# Patient Record
Sex: Male | Born: 1970 | Race: Black or African American | Hispanic: No | Marital: Married | Smoking: Former smoker
Health system: Southern US, Community
[De-identification: ages and names within clinical notes are randomized; demographics above are authoritative.]

## PROBLEM LIST (undated history)

## (undated) DIAGNOSIS — M6740 Ganglion, unspecified site: Secondary | ICD-10-CM

## (undated) DIAGNOSIS — K219 Gastro-esophageal reflux disease without esophagitis: Secondary | ICD-10-CM

## (undated) DIAGNOSIS — R030 Elevated blood-pressure reading, without diagnosis of hypertension: Secondary | ICD-10-CM

## (undated) DIAGNOSIS — M7040 Prepatellar bursitis, unspecified knee: Secondary | ICD-10-CM

## (undated) HISTORY — DX: Ganglion, unspecified site: M67.40

## (undated) HISTORY — DX: Gastro-esophageal reflux disease without esophagitis: K21.9

## (undated) HISTORY — DX: Prepatellar bursitis, unspecified knee: M70.40

## (undated) HISTORY — DX: Elevated blood-pressure reading, without diagnosis of hypertension: R03.0

---

## 1991-12-05 HISTORY — PX: THUMB ARTHROSCOPY: SHX2509

## 2009-07-19 ENCOUNTER — Ambulatory Visit: Payer: Self-pay | Admitting: Family Medicine

## 2009-07-19 DIAGNOSIS — M6740 Ganglion, unspecified site: Secondary | ICD-10-CM

## 2009-07-19 DIAGNOSIS — M7040 Prepatellar bursitis, unspecified knee: Secondary | ICD-10-CM

## 2009-07-19 DIAGNOSIS — K219 Gastro-esophageal reflux disease without esophagitis: Secondary | ICD-10-CM

## 2009-07-19 DIAGNOSIS — R030 Elevated blood-pressure reading, without diagnosis of hypertension: Secondary | ICD-10-CM | POA: Insufficient documentation

## 2009-07-19 HISTORY — DX: Gastro-esophageal reflux disease without esophagitis: K21.9

## 2009-07-19 HISTORY — DX: Prepatellar bursitis, unspecified knee: M70.40

## 2009-07-19 HISTORY — DX: Elevated blood-pressure reading, without diagnosis of hypertension: R03.0

## 2009-07-19 HISTORY — DX: Ganglion, unspecified site: M67.40

## 2011-10-24 ENCOUNTER — Telehealth: Payer: Self-pay | Admitting: Family Medicine

## 2011-10-24 NOTE — Telephone Encounter (Addendum)
Pt wife calling concerning hus bp yesterday was 190/110. Please call pt at [phone]. Pt saw MD over 2 yrs ago.

## 2011-10-24 NOTE — Telephone Encounter (Signed)
Pt returned call. Pls call back on his cell#, which is [phone]

## 2011-10-24 NOTE — Telephone Encounter (Signed)
No answer, and no voice mail. 

## 2011-10-25 ENCOUNTER — Telehealth: Payer: Self-pay | Admitting: *Deleted

## 2011-10-25 NOTE — Telephone Encounter (Signed)
BP reading this am was 143/88, and will schedule a CPX with Dr. Caryl Never after the holiday.

## 2011-10-27 NOTE — Telephone Encounter (Signed)
Benjamin Odom called back to let us know his BP was 143/88, and feeling fine.  Actually, would like to change his CPX appt unless it does go back up again.  Will schedule the CPX and work him in if he need to be seen for high BP.

## 2011-10-27 NOTE — Telephone Encounter (Signed)
Returned pt's call with no answer but left a extended note on Dr. Lucie Leather instructions.

## 2011-10-27 NOTE — Telephone Encounter (Signed)
Recheck today if possible and if still that elevated needs to be seen.  OK to see at end of schedule today if they are able.

## 2011-10-30 NOTE — Telephone Encounter (Signed)
Called patient to see how his bp is doing. Pt said that he has not rechecked bp since call last wk. Pt says that he feels that his bp is doing ok now and pt said that he will call back if there are any future problems.

## 2011-12-01 ENCOUNTER — Encounter: Payer: Self-pay | Admitting: Family Medicine

## 2011-12-01 ENCOUNTER — Encounter: Payer: Self-pay | Admitting: *Deleted

## 2012-01-08 ENCOUNTER — Encounter: Payer: Self-pay | Admitting: Family Medicine

## 2012-01-10 ENCOUNTER — Encounter: Payer: Self-pay | Admitting: Family Medicine

## 2012-01-10 ENCOUNTER — Ambulatory Visit (INDEPENDENT_AMBULATORY_CARE_PROVIDER_SITE_OTHER): Payer: BC Managed Care – PPO | Admitting: Family Medicine

## 2012-01-10 VITALS — BP 160/90 | HR 80 | Temp 98.4°F | Resp 12 | Ht 69.75 in | Wt 242.0 lb

## 2012-01-10 DIAGNOSIS — R030 Elevated blood-pressure reading, without diagnosis of hypertension: Secondary | ICD-10-CM

## 2012-01-10 DIAGNOSIS — Z0000 Encounter for general adult medical examination without abnormal findings: Secondary | ICD-10-CM

## 2012-01-10 LAB — PSA: PSA: 0.19 ng/mL (ref 0.10–4.00)

## 2012-01-10 LAB — BASIC METABOLIC PANEL
Calcium: 9.8 mg/dL (ref 8.4–10.5)
GFR: 114.64 mL/min (ref >60.00)
Potassium: 4.4 mEq/L (ref 3.5–5.1)
Sodium: 138 mEq/L (ref 135–145)

## 2012-01-10 LAB — HEPATIC FUNCTION PANEL
ALT: 23 U/L (ref 0–53)
Total Bilirubin: 0.9 mg/dL (ref 0.3–1.2)

## 2012-01-10 LAB — POCT URINALYSIS DIPSTICK
Bilirubin, UA: NEGATIVE
Glucose, UA: NEGATIVE
Leukocytes, UA: NEGATIVE
Nitrite, UA: NEGATIVE
pH, UA: 6

## 2012-01-10 LAB — TSH: TSH: 1.1 u[IU]/mL (ref 0.35–5.50)

## 2012-01-10 LAB — CBC WITH DIFFERENTIAL/PLATELET
Basophils Absolute: 0 10*3/uL (ref 0.0–0.1)
Eosinophils Absolute: 0 10*3/uL (ref 0.0–0.7)
MCHC: 33.5 g/dL (ref 30.0–36.0)
MCV: 91.2 fl (ref 78.0–100.0)
Monocytes Absolute: 0.4 10*3/uL (ref 0.1–1.0)
Neutrophils Relative %: 73.5 % (ref 43.0–77.0)
Platelets: 362 10*3/uL (ref 150.0–400.0)
RDW: 13.7 % (ref 11.5–14.6)

## 2012-01-10 LAB — LIPID PANEL
HDL: 44.2 mg/dL (ref >39.00)
Triglycerides: 139 mg/dL (ref 0.0–149.0)

## 2012-01-10 NOTE — Progress Notes (Signed)
  Subjective:    Patient ID: Benjamin Odom, male    DOB: [birth year], 41 y.o.   MRN: [id number]  HPI  Patient here for complete physical. He was seen here almost 3 years ago with elevated blood pressure but never followed up. He is exercising about 3 days per week. He has strong family history of hypertension. He has never had any lab work. Last tetanus 2007. Denies any headaches, chest pains, or dizziness. Nonsmoker. Drinks occasional beer on weekends  Past Medical History  Diagnosis Date  . GERD 07/19/2009  . Prepatellar bursitis 07/19/2009  . GANGLION CYST 07/19/2009  . ELEVATED BLOOD PRESSURE 07/19/2009   Past Surgical History  Procedure Date  . Thumb arthroscopy 1993    re-attachment Rt thumb    reports that he quit smoking about 14 years ago. His smoking use included Cigarettes. He has a 3 pack-year smoking history. He does not have any smokeless tobacco history on file. His alcohol and drug histories not on file. family history includes Arthritis in his other; Cancer in his other; Diabetes in his maternal grandfather, mother, and other; and Hypertension in his mother and other. No Known Allergies    Review of Systems  Constitutional: Negative for fever, activity change, appetite change and fatigue.  HENT: Negative for ear pain, congestion and trouble swallowing.   Eyes: Negative for pain and visual disturbance.  Respiratory: Negative for cough, shortness of breath and wheezing.   Cardiovascular: Negative for chest pain and palpitations.  Gastrointestinal: Negative for nausea, vomiting, abdominal pain, diarrhea, constipation, blood in stool, abdominal distention and rectal pain.  Genitourinary: Negative for dysuria, hematuria and testicular pain.  Musculoskeletal: Negative for joint swelling and arthralgias.  Skin: Negative for rash.  Neurological: Negative for dizziness, syncope and headaches.  Hematological: Negative for adenopathy.  Psychiatric/Behavioral: Negative for  confusion and dysphoric mood.       Objective:   Physical Exam  Constitutional: He is oriented to person, place, and time. He appears well-developed and well-nourished. No distress.  HENT:  Head: Normocephalic and atraumatic.  Right Ear: External ear normal.  Left Ear: External ear normal.  Mouth/Throat: Oropharynx is clear and moist.  Eyes: Conjunctivae and EOM are normal. Pupils are equal, round, and reactive to light.  Neck: Normal range of motion. Neck supple. No thyromegaly present.  Cardiovascular: Normal rate, regular rhythm and normal heart sounds.   No murmur heard. Pulmonary/Chest: No respiratory distress. He has no wheezes. He has no rales.  Abdominal: Soft. Bowel sounds are normal. He exhibits no distension and no mass. There is no tenderness. There is no rebound and no guarding.  Musculoskeletal: He exhibits no edema.  Lymphadenopathy:    He has no cervical adenopathy.  Neurological: He is alert and oriented to person, place, and time. He displays normal reflexes. No cranial nerve deficit.  Skin: No rash noted.  Psychiatric: He has a normal mood and affect.          Assessment & Plan:  #1 complete physical. Continue weight loss and exercise efforts. Obtain screening lab work. #2 elevated blood pressure. Handout on hypertension given. Work on weight loss. Home blood pressure monitor and record readings and followup in one month to reassess. Consider medications then if consistently above 140/90.

## 2012-01-10 NOTE — Patient Instructions (Addendum)
Get home blood pressure monitor and record readings and bring back in one month to review Continue exercise and weight loss efforts.  Hypertension As your heart beats, it forces blood through your arteries. This force is your blood pressure. If the pressure is too high, it is called hypertension (HTN) or high blood pressure. HTN is dangerous because you may have it and not know it. High blood pressure may mean that your heart has to work harder to pump blood. Your arteries may be narrow or stiff. The extra work puts you at risk for heart disease, stroke, and other problems.  Blood pressure consists of two numbers, a higher number over a lower, 110/72, for example. It is stated as "110 over 72." The ideal is below 120 for the top number (systolic) and under 80 for the bottom (diastolic). Write down your blood pressure today. You should pay close attention to your blood pressure if you have certain conditions such as:  Heart failure.   Prior heart attack.   Diabetes   Chronic kidney disease.   Prior stroke.   Multiple risk factors for heart disease.  To see if you have HTN, your blood pressure should be measured while you are seated with your arm held at the level of the heart. It should be measured at least twice. A one-time elevated blood pressure reading (especially in the Emergency Department) does not mean that you need treatment. There may be conditions in which the blood pressure is different between your right and left arms. It is important to see your caregiver soon for a recheck. Most people have essential hypertension which means that there is not a specific cause. This type of high blood pressure may be lowered by changing lifestyle factors such as:  Stress.   Smoking.   Lack of exercise.   Excessive weight.   Drug/tobacco/alcohol use.   Eating less salt.  Most people do not have symptoms from high blood pressure until it has caused damage to the body. Effective treatment  can often prevent, delay or reduce that damage. TREATMENT  When a cause has been identified, treatment for high blood pressure is directed at the cause. There are a large number of medications to treat HTN. These fall into several categories, and your caregiver will help you select the medicines that are best for you. Medications may have side effects. You should review side effects with your caregiver. If your blood pressure stays high after you have made lifestyle changes or started on medicines,   Your medication(s) may need to be changed.   Other problems may need to be addressed.   Be certain you understand your prescriptions, and know how and when to take your medicine.   Be sure to follow up with your caregiver within the time frame advised (usually within two weeks) to have your blood pressure rechecked and to review your medications.   If you are taking more than one medicine to lower your blood pressure, make sure you know how and at what times they should be taken. Taking two medicines at the same time can result in blood pressure that is too low.  SEEK IMMEDIATE MEDICAL CARE IF:  You develop a severe headache, blurred or changing vision, or confusion.   You have unusual weakness or numbness, or a faint feeling.   You have severe chest or abdominal pain, vomiting, or breathing problems.  MAKE SURE YOU:   Understand these instructions.   Will watch your condition.  Will get help right away if you are not doing well or get worse.  Document Released: 11/20/2005 Document Revised: 08/02/2011 Document Reviewed: 07/10/2008 [facility] Patient Information 2012 [place], [place].

## 2012-01-11 NOTE — Progress Notes (Signed)
Quick Note:  Pt informed ______ 

## 2012-02-12 ENCOUNTER — Ambulatory Visit (INDEPENDENT_AMBULATORY_CARE_PROVIDER_SITE_OTHER): Payer: BC Managed Care – PPO | Admitting: Family Medicine

## 2012-02-12 ENCOUNTER — Encounter: Payer: Self-pay | Admitting: Family Medicine

## 2012-02-12 VITALS — BP 150/92 | Temp 98.6°F | Wt 246.0 lb

## 2012-02-12 DIAGNOSIS — R030 Elevated blood-pressure reading, without diagnosis of hypertension: Secondary | ICD-10-CM

## 2012-02-12 DIAGNOSIS — I10 Essential (primary) hypertension: Secondary | ICD-10-CM | POA: Insufficient documentation

## 2012-02-12 MED ORDER — LOSARTAN POTASSIUM 50 MG PO TABS: 50.0000 mg | ORAL_TABLET | Freq: Every day | ORAL | Status: DC

## 2012-02-12 NOTE — Progress Notes (Signed)
  Subjective:    Patient ID: Benjamin Odom, male    DOB: [birth year], 41 y.o.   MRN: [id number]  HPI  Followup hypertension. Monitoring home blood pressure cuff. Consistently 140-150 systolic and 90s diastolic. Exercising regularly sometimes one hour per day with aerobic exercise. No headaches or dizziness. Never treated for hypertension. Trying to be cautious with his sodium intake. No excessive alcohol use.   Review of Systems  Constitutional: Negative for fatigue.  Eyes: Negative for visual disturbance.  Respiratory: Negative for cough, chest tightness and shortness of breath.   Cardiovascular: Negative for chest pain, palpitations and leg swelling.  Neurological: Negative for dizziness, syncope, weakness, light-headedness and headaches.       Objective:   Physical Exam  Constitutional: He appears well-developed and well-nourished.  Neck: Neck supple. No thyromegaly present.  Cardiovascular: Normal rate and regular rhythm.   Pulmonary/Chest: Effort normal and breath sounds normal. No respiratory distress. He has no wheezes. He has no rales.  Musculoskeletal: He exhibits no edema.          Assessment & Plan:  Hypertension. Currently untreated. No improvement over the past month. Start losartan 50 mg daily. Continue home monitoring. Reassess here in 2 months. Continue weight loss efforts.

## 2012-03-19 ENCOUNTER — Telehealth: Payer: Self-pay | Admitting: Family Medicine

## 2012-03-19 NOTE — Telephone Encounter (Signed)
Questions re: Cozar Losartan 50mg  . Pls call.

## 2012-03-19 NOTE — Telephone Encounter (Signed)
LMTCB

## 2012-03-21 NOTE — Telephone Encounter (Signed)
Pt reports  Initially he was bloated, thinks that sx may be improving as he gets used to the med.  He is really concerned as his appetite has increased since new med, has gained at least 5-6 pounds.

## 2012-03-21 NOTE — Telephone Encounter (Signed)
Weight gain is not typical for this medication.  Would stay the course until follow up.

## 2012-03-22 NOTE — Telephone Encounter (Signed)
Pt informed, has F/U in 3 weeks

## 2012-04-08 ENCOUNTER — Ambulatory Visit (INDEPENDENT_AMBULATORY_CARE_PROVIDER_SITE_OTHER): Payer: BC Managed Care – PPO | Admitting: Family Medicine

## 2012-04-08 ENCOUNTER — Encounter: Payer: Self-pay | Admitting: Family Medicine

## 2012-04-08 VITALS — BP 128/80 | Temp 98.5°F | Wt 255.0 lb

## 2012-04-08 DIAGNOSIS — I10 Essential (primary) hypertension: Secondary | ICD-10-CM

## 2012-04-08 NOTE — Patient Instructions (Signed)

## 2012-04-08 NOTE — Progress Notes (Signed)
  Subjective:    Patient ID: Benjamin Odom, male    DOB: 1971/09/03, 41 y.o.   MRN: [id number]  HPI  Followup hypertension. Add losartan 50 mg last visit. He's gained about 8 or 9 pounds since then. He felt this elevated his appetite. Otherwise no side effects. Compliant with therapy. Home blood pressures have improved mostly 120s to 130 systolic and 80s diastolic. No headaches or dizziness.  He has recently been inconsistent with exercise but plans start back soon.  Past Medical History  Diagnosis Date  . GERD 07/19/2009  . Prepatellar bursitis 07/19/2009  . GANGLION CYST 07/19/2009  . ELEVATED BLOOD PRESSURE 07/19/2009   Past Surgical History  Procedure Date  . Thumb arthroscopy 1993    re-attachment Rt thumb    reports that he quit smoking about 14 years ago. His smoking use included Cigarettes. He has a 3 pack-year smoking history. He does not have any smokeless tobacco history on file. His alcohol and drug histories not on file. family history includes Arthritis in his other; Cancer in his other; Diabetes in his maternal grandfather, mother, and other; and Hypertension in his mother and other. No Known Allergies    Review of Systems  Constitutional: Negative for fatigue.  Eyes: Negative for visual disturbance.  Respiratory: Negative for cough, chest tightness and shortness of breath.   Cardiovascular: Negative for chest pain, palpitations and leg swelling.  Neurological: Negative for dizziness, syncope, weakness, light-headedness and headaches.       Objective:   Physical Exam  Constitutional: He is oriented to person, place, and time. He appears well-developed and well-nourished.  Neck: Neck supple. No thyromegaly present.  Cardiovascular: Normal rate and regular rhythm.   No murmur heard. Pulmonary/Chest: Effort normal and breath sounds normal. No respiratory distress. He has no wheezes. He has no rales.  Musculoskeletal: He exhibits no edema.  Neurological: He is  alert and oriented to person, place, and time. No cranial nerve deficit.          Assessment & Plan:  #1 hypertension. Stable and improved. Continue weight loss efforts. Routine followup 6 months

## 2012-10-07 ENCOUNTER — Encounter: Payer: Self-pay | Admitting: Family Medicine

## 2012-10-07 ENCOUNTER — Ambulatory Visit (INDEPENDENT_AMBULATORY_CARE_PROVIDER_SITE_OTHER): Payer: BC Managed Care – PPO | Admitting: Family Medicine

## 2012-10-07 VITALS — BP 150/90 | Temp 98.5°F | Wt 246.0 lb

## 2012-10-07 DIAGNOSIS — I10 Essential (primary) hypertension: Secondary | ICD-10-CM

## 2012-10-07 NOTE — Progress Notes (Signed)
  Subjective:    Patient ID: Benjamin Odom, male    DOB: [birth year], 41 y.o.   MRN: [id number]  HPI  Patient here for followup hypertension. He stopped taking blood pressure medication on his own a couple months ago. He was having some drowsiness which he attributed to losartan. Blood pressure was very well controlled on medication. Poor compliance with diet and exercise recently. No headaches. No dizziness. Blood pressures at home around 140-150 systolic and upper 80s to 90 diastolic off medication.   Review of Systems  Constitutional: Negative for fatigue.  Eyes: Negative for visual disturbance.  Respiratory: Negative for cough, chest tightness and shortness of breath.   Cardiovascular: Negative for chest pain, palpitations and leg swelling.  Neurological: Negative for dizziness, syncope, weakness, light-headedness and headaches.       Objective:   Physical Exam  Constitutional: He appears well-developed and well-nourished.  Neck: Neck supple. No thyromegaly present.  Cardiovascular: Normal rate and regular rhythm.   Pulmonary/Chest: Effort normal and breath sounds normal. No respiratory distress. He has no wheezes. He has no rales.  Musculoskeletal: He exhibits no edema.          Assessment & Plan:  Hypertension. Poorly controlled off medication. Start back losartan 50 mg once daily. Establish more consistent aerobic exercise. Try to lose some weight. Touch base 2-3 weeks if blood pressure not consistently less than 140/90. Routine followup 6 months

## 2012-10-07 NOTE — Patient Instructions (Signed)
Get back on Losartan and be in touch in 2 weeks if BP not consistently < 140/90.

## 2013-04-07 ENCOUNTER — Ambulatory Visit (INDEPENDENT_AMBULATORY_CARE_PROVIDER_SITE_OTHER): Payer: BC Managed Care – PPO | Admitting: Family Medicine

## 2013-04-07 ENCOUNTER — Encounter: Payer: Self-pay | Admitting: Family Medicine

## 2013-04-07 VITALS — BP 150/90 | Temp 98.7°F | Wt 250.0 lb

## 2013-04-07 DIAGNOSIS — I10 Essential (primary) hypertension: Secondary | ICD-10-CM

## 2013-04-07 NOTE — Progress Notes (Signed)
  Subjective:    Patient ID: Benjamin Odom, male    DOB: Dec 21, 1970, 42 y.o.   MRN: [id number]  HPI Patient here for followup hypertension He has history of poor compliance. Last visit we started back losartan and again he has taken himself off. He states he stopped this because of concern for possible side effects such as" slow metabolism". We explained that weight gain and metabolism changes are not reported with this medication  Poor compliance with diet but has recently started back some exercise. 4 pound weight gain since last visit. Home blood pressures are reviewed and generally ranging 130 -140s systolic and 70s to 80s diastolic. Denies recent headache. No dyspnea. No peripheral edema. No dizziness  Past Medical History  Diagnosis Date  . GERD 07/19/2009  . Prepatellar bursitis 07/19/2009  . GANGLION CYST 07/19/2009  . ELEVATED BLOOD PRESSURE 07/19/2009   Past Surgical History  Procedure Laterality Date  . Thumb arthroscopy  1993    re-attachment Rt thumb    reports that he quit smoking about 15 years ago. His smoking use included Cigarettes. He has a 3 pack-year smoking history. He does not have any smokeless tobacco history on file. His alcohol and drug histories are not on file. family history includes Arthritis in his other; Cancer in his other; Diabetes in his maternal grandfather, mother, and other; and Hypertension in his mother and other. No Known Allergies    Review of Systems  Constitutional: Negative for fever, chills and fatigue.  Eyes: Negative for visual disturbance.  Respiratory: Negative for cough, chest tightness and shortness of breath.   Cardiovascular: Negative for chest pain, palpitations and leg swelling.  Neurological: Negative for dizziness, syncope, weakness, light-headedness and headaches.       Objective:   Physical Exam  Constitutional: He appears well-developed and well-nourished.  Neck: Neck supple. No thyromegaly present.  Cardiovascular:  Normal rate and regular rhythm.  Exam reveals no gallop.   No murmur heard. Pulmonary/Chest: Effort normal and breath sounds normal. No respiratory distress. He has no wheezes. He has no rales.  Musculoskeletal: He exhibits no edema.          Assessment & Plan:  Hypertension. Poorly controlled by today's reading. Patient requesting trial of weight loss and aerobic exercise and reassess one to 2 months. Bring home blood pressure cuff then. Consider amlodipine if not better controlled at that time

## 2013-04-07 NOTE — Patient Instructions (Signed)
Lose some weight Continue with aerobic exercise Bring your BP cuff back at follow up to compare with ours.

## 2013-06-09 ENCOUNTER — Encounter: Payer: Self-pay | Admitting: Family Medicine

## 2013-06-09 ENCOUNTER — Ambulatory Visit (INDEPENDENT_AMBULATORY_CARE_PROVIDER_SITE_OTHER): Payer: BC Managed Care – PPO | Admitting: Family Medicine

## 2013-06-09 VITALS — BP 136/80 | HR 90 | Temp 98.8°F | Wt 248.0 lb

## 2013-06-09 DIAGNOSIS — I10 Essential (primary) hypertension: Secondary | ICD-10-CM

## 2013-06-09 NOTE — Progress Notes (Signed)
  Subjective:    Patient ID: Benjamin Odom, male    DOB: [birth year], 42 y.o.   MRN: [id number]  HPI Followup elevated blood pressure Last visit, patient decided against medication and preference for lifestyle management. He started exercising more and has lost 2 pounds. He reduced sodium intake. Previously he had been on losartan but currently not taking any blood pressure medications. He brings in his blood pressure cuff today but is not been monitoring at home. No headaches or dizziness   Review of Systems  Constitutional: Negative for fatigue.  Eyes: Negative for visual disturbance.  Respiratory: Negative for cough, chest tightness and shortness of breath.   Cardiovascular: Negative for chest pain, palpitations and leg swelling.  Neurological: Negative for dizziness, syncope, weakness, light-headedness and headaches.       Objective:   Physical Exam  Constitutional: He appears well-developed and well-nourished.  Neck: Neck supple. No thyromegaly present.  Cardiovascular: Normal rate and regular rhythm.   Pulmonary/Chest: Effort normal and breath sounds normal. No respiratory distress. He has no wheezes. He has no rales.  Musculoskeletal: He exhibits no edema.          Assessment & Plan:  Hypertension. Currently stable by reading today was 136/80. His blood pressure cuff read 171/105. I confirmed blood pressure readings in both arms and also confirmed systolic by palpation and got consistency in both arms. Continue lifestyle modification. He'll look at getting a new home blood pressure monitor

## 2013-06-09 NOTE — Patient Instructions (Addendum)

## 2015-03-24 ENCOUNTER — Ambulatory Visit (INDEPENDENT_AMBULATORY_CARE_PROVIDER_SITE_OTHER): Payer: BLUE CROSS/BLUE SHIELD | Admitting: Family Medicine

## 2015-03-24 ENCOUNTER — Encounter: Payer: Self-pay | Admitting: Family Medicine

## 2015-03-24 VITALS — BP 134/90 | HR 84 | Temp 98.5°F | Wt 266.0 lb

## 2015-03-24 DIAGNOSIS — M25522 Pain in left elbow: Secondary | ICD-10-CM

## 2015-03-24 NOTE — Progress Notes (Signed)
   Subjective:    Patient ID: Benjamin Odom, male    DOB: 11/09/1971, 44 y.o.   MRN: [id number]  HPI Patient seen with left elbow pain. Duration about 4 months. Denies any injury. He has intermittent pain, anterior aspect left elbow. Has never noted any bruising or swelling or any warmth or erythema. Sometimes has pain with elbow flexion and occasionally with lifting weights. He is not aware of any weakness. No cervical neck pain. He has not tried any icing or medications. He was concerned he might have felt some "thickening" on anterior aspect of the left elbow.  Past Medical History  Diagnosis Date  . GERD 07/19/2009  . Prepatellar bursitis 07/19/2009  . GANGLION CYST 07/19/2009  . ELEVATED BLOOD PRESSURE 07/19/2009   Past Surgical History  Procedure Laterality Date  . Thumb arthroscopy  1993    re-attachment Rt thumb    reports that he quit smoking about 17 years ago. His smoking use included Cigarettes. He has a 3 pack-year smoking history. He does not have any smokeless tobacco history on file. His alcohol and drug histories are not on file. family history includes Arthritis in his other; Cancer in his other; Diabetes in his maternal grandfather, mother, and other; Hypertension in his mother and other. No Known Allergies    Review of Systems  Neurological: Negative for weakness and numbness.       Objective:   Physical Exam  Constitutional: He appears well-developed and well-nourished.  Cardiovascular: Normal rate and regular rhythm.   Musculoskeletal:  Left elbow full range of motion. No distinct masses palpated. No pain with suppuration or pronation. No medial or lateral epicondylar tenderness. No tenderness over the proximal or distal biceps tendons. No reproducible pain. Full strength          Assessment & Plan:  Left elbow pain. Etiology unclear. He does not have evidence clinically from exam to suggest likely tendinitis. Set up sports medicine referral for further  evaluation given duration. We did not palpate any worrisome masses on exam

## 2015-03-24 NOTE — Patient Instructions (Signed)
We will call you with Sports Medicine referral. 

## 2015-03-24 NOTE — Progress Notes (Signed)
Pre visit review using our clinic review tool, if applicable. No additional management support is needed unless otherwise documented below in the visit note. 

## 2015-04-06 ENCOUNTER — Ambulatory Visit: Payer: BLUE CROSS/BLUE SHIELD | Admitting: Family Medicine

## 2015-04-14 ENCOUNTER — Other Ambulatory Visit (INDEPENDENT_AMBULATORY_CARE_PROVIDER_SITE_OTHER): Payer: BLUE CROSS/BLUE SHIELD

## 2015-04-14 ENCOUNTER — Encounter: Payer: Self-pay | Admitting: Family Medicine

## 2015-04-14 ENCOUNTER — Encounter: Payer: Self-pay | Admitting: *Deleted

## 2015-04-14 ENCOUNTER — Ambulatory Visit (INDEPENDENT_AMBULATORY_CARE_PROVIDER_SITE_OTHER): Payer: BLUE CROSS/BLUE SHIELD | Admitting: Family Medicine

## 2015-04-14 VITALS — BP 138/76 | HR 91 | Ht 69.75 in | Wt 268.0 lb

## 2015-04-14 DIAGNOSIS — M25522 Pain in left elbow: Secondary | ICD-10-CM

## 2015-04-14 DIAGNOSIS — G5610 Other lesions of median nerve, unspecified upper limb: Secondary | ICD-10-CM | POA: Insufficient documentation

## 2015-04-14 DIAGNOSIS — G5612 Other lesions of median nerve, left upper limb: Secondary | ICD-10-CM | POA: Diagnosis not present

## 2015-04-14 MED ORDER — MELOXICAM 15 MG PO TABS: 15.0000 mg | ORAL_TABLET | Freq: Every day | ORAL | Status: DC

## 2015-04-14 NOTE — Assessment & Plan Note (Signed)
I believe the patient's repetitive work is likely causing the symptoms. Discussed with patient at this time I like to take him out of lifting repetitive activity for the next 7-10 days. We discussed icing protocol, compression sleeve, as well as oral anti-inflammatories for the next 10 days. Patient will try to make these different changes and come back and see me again in 2 weeks. At that time if patient is able to do more activity will advance him accordingly. Otherwise further imaging may be necessary.

## 2015-04-14 NOTE — Patient Instructions (Addendum)
Good to see you.  Ice 20 minutes 2 times daily. Usually after activity and before bed. Exercises 3 times a week.  compression sleeve to elbow.  meloxicam daily 10 days then as needed See me again in 10 days.   Pronator Syndrome with Rehab Pronator syndrome is a disorder of the nervous system that causes pain, weakness, and loss of feeling in the upper arm, elbow, and hand. The condition is caused by compression of a major nerve in the arm (the median nerve) by muscles and/or ligament-like structures in the forearm. SYMPTOMS   Tingling, numbness, loss of feeling, and/ or a burning sensation in the hand and fingers, especially the thumb and first three fingers.  Shooting pain from the elbow to the wrist and hand.  Stiffness and/ or cramping of the hands in the morning.  Shiny, dry skin on the hand.  Reduced performance in sports requiring strong grip. CAUSES  Pronator syndrome is caused by pressure being placed on the median nerve in the forearm. This pressure reduces the function of the nerve, causing symptoms. The pressure may be caused by swollen, inflamed or scarred tissue between muscles of the forearm. The compression may also be caused by inflammation of the nerve due to infection. RISK INCREASES WITH:  Activities that involve repetitive and/or strenuous forearm and wrist movements (racquet sports or carpentry).  Poor strength and flexibility.  Failure to warm-up properly before activity.  Diabetes mellitus.  Underactive thyroid gland (hypothyroidism) PREVENTION  Warm up and stretch properly before activity.  Allow for adequate recovery between workouts.  Maintain physical fitness:  Strength, flexibility, and endurance.  Cardiovascular fitness.  Learn and use proper technique. When possible, have a coach correct improper technique.  Wear properly fitted and padded equipment. PROGNOSIS  If treated properly, then the symptoms of pronator syndrome usually resolve  with treatment, and occasionally it may heal spontaneously. If the muscle begins to waste (atrophy), then surgery is necessary. RELATED COMPLICATIONS  Permanent nerve damage including:  Weakness, numbness, or paralysis of the hand or forearm. TREATMENT  Treatment initially involves resting form any activities that aggravate the symptoms. Certain individuals find that shaking their hands or dangling the arm helps relieve symptoms. The use of strengthening and stretching exercises may help reduce pain with activity. These exercises may be performed at home or with referral to a therapist. If non-surgical (conservative) treatment is unsuccessful or if the muscles begin to atrophy, then surgery may be necessary. Surgery usually provides complete relief.  MEDICATION   If pain medication is necessary, then nonsteroidal anti-inflammatory medications, such as aspirin and ibuprofen, or other minor pain relievers, such as acetaminophen, are often recommended.  Do not take pain medication for 7 days before surgery.  Prescription pain relievers may be given if deemed necessary by your caregiver. Use only as directed and only as much as you need. HEAT AND COLD  Cold treatment (icing) relieves pain and reduces inflammation. Cold treatment should be applied for 10 to 15 minutes every 2 to 3 hours for inflammation and pain and immediately after any activity that aggravates your symptoms. Use ice packs or massage the area with a piece of ice (ice massage).  Heat treatment may be used prior to performing the stretching and strengthening activities prescribed by your caregiver, physical therapist, or athletic trainer. Use a heat pack or soak the injury in warm water. SEEK MEDICAL CARE IF:  Treatment seems to offer no benefit, or the condition worsens.  Any medications produce adverse side  effects.  Any complications from surgery occur:  Pain, numbness, or coldness in the extremity operated  upon.  Discoloration of the nail beds (they become blue or gray) of the extremity operated upon.  Signs of infections (fever, pain, inflammation, redness, or persistent bleeding). EXERCISES RANGE OF MOTION (ROM) AND STRETCHING EXERCISES - Pronator Syndrome These exercises may help you when beginning to rehabilitate your injury. Your symptoms may resolve with or without further involvement from your physician, physical therapist or athletic trainer. While completing these exercises, remember:   Restoring tissue flexibility helps normal motion to return to the joints. This allows healthier, less painful movement and activity.  An effective stretch should be held for at least 30 seconds.  A stretch should never be painful. You should only feel a gentle lengthening or release in the stretched tissue. RANGE OF MOTION - Wrist Flexion, Active-Assisted  Extend your right / left elbow with your fingers pointing down.*  Gently pull the back of your hand towards you until you feel a gentle stretch on the top of your forearm.  Hold this position for __________ seconds. Repeat __________ times. Complete this exercise __________ times per day.  *If directed by your physician, physical therapist or athletic trainer, complete this stretch with your elbow bent rather than extended. RANGE OF MOTION - Wrist Extension, Active-Assisted   Extend your right / left elbow and turn your palm upwards.*  Gently pull your palm/fingertips back so your wrist extends and your fingers point more toward the ground.  You should feel a gentle stretch on the inside of your forearm.  Hold this position for __________ seconds. Repeat __________ times. Complete this exercise __________ times per day. *If directed by your physician, physical therapist or athletic trainer, complete this stretch with your elbow bent, rather than extended. STRETCH - Wrist Flexion   Place the back of your right / left hand on a tabletop  leaving your elbow slightly bent. Your fingers should point away from your body.  Gently press the back of your hand down onto the table by straightening your elbow. You should feel a stretch on the top of your forearm.  Hold this position for __________ seconds. Repeat __________ times. Complete this stretch __________ times per day.  STRETCH - Wrist Extension   Place your right / left fingertips on a tabletop leaving your elbow slightly bent. Your fingers should point backwards.  Gently press your fingers and palm down onto the table by straightening your elbow. You should feel a stretch on the inside of your forearm.  Hold this position for __________ seconds. Repeat __________ times. Complete this stretch __________ times per day.  STRENGTH - Pronator Syndrome These exercises may help you when beginning to rehabilitate your injury. They may resolve your symptoms with or without further involvement from your physician, physical therapist or athletic trainer. While completing these exercises, remember:   Muscles can gain both the endurance and the strength needed for everyday activities through controlled exercises.  Complete these exercises as instructed by your physician, physical therapist or athletic trainer. Progress the resistance and repetitions only as guided. STRENGTH - Wrist Flexors  Sit with your right / left forearm palm-up and fully supported. Your elbow should be resting below the height of your shoulder. Allow your wrist to extend over the edge of the surface.  Loosely holding a __________ weight or a piece of rubber exercise band/tubing, slowly curl your hand up toward your forearm.  Hold this position for __________ seconds. Slowly  lower the wrist back to the starting position in a controlled manner. Repeat __________ times. Complete this exercise __________ times per day.  STRENGTH - Wrist Extensors  Sit with your right / left forearm palm-down and fully supported.  Your elbow should be resting below the height of your shoulder. Allow your wrist to extend over the edge of the surface.  Loosely holding a __________ weight or a piece of rubber exercise band/tubing, slowly curl your hand up toward your forearm.  Hold this position for __________ seconds. Slowly lower the wrist back to the starting position in a controlled manner. Repeat __________ times. Complete this exercise __________ times per day.  STRENGTH - Radial Deviators  Stand with a ____________________ weight in your right / left hand or sit holding on to the rubber exercise band/tubing with your arm supported.  Raise your hand upward in front of you or pull up on the rubber tubing.  Hold this position for __________ seconds and then slowly lower the wrist back to the starting position. Repeat __________ times. Complete this exercise __________ times per day. STRENGTH - Forearm Supinators   Sit with your right / left forearm supported on a table, keeping your elbow below shoulder height. Rest your hand over the edge, palm down.  Gently grip a hammer or a soup ladle.  Without moving your elbow, slowly turn your palm and hand upward to a "thumbs-up" position.  Hold this position for __________ seconds. Slowly return to the starting position. Repeat __________ times. Complete this exercise __________ times per day.  STRENGTH - Forearm Pronators   Sit with your right / left forearm supported on a table, keeping your elbow below shoulder height. Rest your hand over the edge, palm up.  Gently grip a hammer or a soup ladle.  Without moving your elbow, slowly turn your palm and hand upward to a "thumbs-up" position.  Hold this position for __________ seconds. Slowly return to the starting position. Repeat __________ times. Complete this exercise __________ times per day.  Document Released: 11/20/2005 Document Revised: 02/12/2012 Document Reviewed: 03/04/2009 [facility] Patient Information  2015 [employer] [place]. This information is not intended to replace advice given to you by your health care provider. Make sure you discuss any questions you have with your health care provider.

## 2015-04-14 NOTE — Progress Notes (Signed)
Pre visit review using our clinic review tool, if applicable. No additional management support is needed unless otherwise documented below in the visit note. 

## 2015-04-14 NOTE — Progress Notes (Signed)
Tawana ScaleZach Kostas Marrow D.O. [facility] [address] [place], [place] [address] Phone: [phone] Subjective:    I'm seeing this patient by the request  of:  Kristian CoveyBURCHETTE,BRUCE W, MD   CC: Left elbow pain  OZH:[id number]:Subjective Antionette CharBrian C Pen is a 44 y.o. male coming in with complaint of left elbow pain. Patient has had this pain for quite some time. Patient states that recently and this seems to be getting worse. Patient describes it as more of a dull aching pain that seems to be worse after repetitive activity as well as when holding his one year-old son. Patient states that in the mornings it is difficult to actually fully extend his elbow. States it hurts more on the medial anterior aspect of the elbow. Sometimes feels like there is a palpable mass noted. Patient denies any numbness or radiation down the hand denies any weakness but states that the pain sometimes can make him stop activities. Denies any nighttime awakening. Does not remember any specific injury.  Past Medical History  Diagnosis Date  . GERD 07/19/2009  . Prepatellar bursitis 07/19/2009  . GANGLION CYST 07/19/2009  . ELEVATED BLOOD PRESSURE 07/19/2009   Past Surgical History  Procedure Laterality Date  . Thumb arthroscopy  1993    re-attachment Rt thumb   History  Substance Use Topics  . Smoking status: Former Smoker -- 0.30 packs/day for 10 years    Types: Cigarettes    Quit date: 01/09/1998  . Smokeless tobacco: Not on file  . Alcohol Use: Not on file   No Known Allergies Family History  Problem Relation Age of Onset  . Arthritis Other   . Diabetes Other   . Hypertension Other   . Cancer Other     prostate  . Diabetes Mother   . Hypertension Mother   . Diabetes Maternal Grandfather         Past medical history, social, surgical and family history all reviewed in electronic medical record.   Review of Systems: No headache, visual changes, nausea, vomiting, diarrhea, constipation, dizziness, abdominal  pain, skin rash, fevers, chills, night sweats, weight loss, swollen lymph nodes, body aches, joint swelling, muscle aches, chest pain, shortness of breath, mood changes.   Objective Blood pressure 138/76, pulse 91, height 5' 9.75" (1.772 m), weight 268 lb (121.564 kg), SpO2 98 %.  General: No apparent distress alert and oriented x3 mood and affect normal, dressed appropriately.  HEENT: Pupils equal, extraocular movements intact  Respiratory: Patient's speak in full sentences and does not appear short of breath  Cardiovascular: No lower extremity edema, non tender, no erythema  Skin: Warm dry intact with no signs of infection or rash on extremities or on axial skeleton.  Abdomen: Soft nontender  Neuro: Cranial nerves II through XII are intact, neurovascularly intact in all extremities with 2+ DTRs and 2+ pulses.  Lymph: No lymphadenopathy of posterior or anterior cervical chain or axillae bilaterally.  Gait normal with good balance and coordination.  MSK:  Non tender with full range of motion and good stability and symmetric strength and tone of shoulders, elbows, wrist, hip, knee and ankles bilaterally.  Elbow: Left Unremarkable to inspection. Limitation in supination pronation, less than 5 bilaterally. Strength is full to all of the above directions Stable to varus, valgus stress. Negative moving valgus stress test. Tender to palpation within the bicipital fossa. Ulnar nerve does not sublux. Negative cubital tunnel Tinel's. Contralateral shoulder elbow unremarkable  Musculoskeletal ultrasound was performed and interpreted by Terrilee FilesZach Sofia Vanmeter  D.O.   Elbow: Left Lateral epicondyle and common extensor tendon origin visualized.  No edema, effusions, or avulsions seen.  Radial head unremarkable and located in annular ligament Medial epicondyle and common flexor tendon origin visualized.  No edema, effusions, or avulsions seen. Ulnar nerve in cubital tunnel unremarkable. Patient though does have  what appears to be hypoechoic changes over the pronator tendon itself. No significant tear noted. Patient also has some mild tendinosis of the brachial radialis tendon Olecranon and triceps insertion visualized and unremarkable without edema, effusion, or avulsion.  No signs olecranon bursitis. Power doppler signal normal.  IMPRESSION:  Pronator syndrome  Procedure note 97110; 15 minutes spent for Therapeutic exercises as stated in above notes.  This included exercises focusing on stretching, strengthening, with significant focus on eccentric aspects.  Patient given flexion-extension as well as pronation and supination exercises. Discussed strength training with more home activities and theraband.  Proper technique shown and discussed handout in great detail with ATC.  All questions were discussed and answered.      Impression and Recommendations:     This case required medical decision making of moderate complexity.

## 2015-04-27 ENCOUNTER — Encounter: Payer: Self-pay | Admitting: *Deleted

## 2015-04-27 ENCOUNTER — Ambulatory Visit (INDEPENDENT_AMBULATORY_CARE_PROVIDER_SITE_OTHER): Payer: BLUE CROSS/BLUE SHIELD | Admitting: Family Medicine

## 2015-04-27 ENCOUNTER — Encounter: Payer: Self-pay | Admitting: Family Medicine

## 2015-04-27 VITALS — BP 148/96 | HR 88 | Ht 69.75 in | Wt 269.0 lb

## 2015-04-27 DIAGNOSIS — G5612 Other lesions of median nerve, left upper limb: Secondary | ICD-10-CM

## 2015-04-27 MED ORDER — PREDNISONE 50 MG PO TABS: 50.0000 mg | ORAL_TABLET | Freq: Every day | ORAL | Status: DC

## 2015-04-27 MED ORDER — NITROGLYCERIN 0.2 MG/HR TD PT24: MEDICATED_PATCH | TRANSDERMAL | Status: DC

## 2015-04-27 NOTE — Progress Notes (Signed)
  Tawana ScaleZach Giulia Hickey D.O. [facility] [address] [place], [place] [address] Phone: [phone] Subjective:    CC: Left elbow pain follow-up  HQI:[id number]:Subjective Benjamin CharBrian C Odom is a 44 y.o. 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 Patient was given home exercises,icing protocol, and oral anti-inflammatory's. Patient stateshe is approximately 30% better. Patient though has not been on significant light duty at work. Patient is just lifting less boxes and doing more trips. Patient denies any radiation down the arm or any new symptoms but states that he would like to be better than he has at this time.  Past Medical History  Diagnosis Date  . GERD 07/19/2009  . Prepatellar bursitis 07/19/2009  . GANGLION CYST 07/19/2009  . ELEVATED BLOOD PRESSURE 07/19/2009   Past Surgical History  Procedure Laterality Date  . Thumb arthroscopy  1993    re-attachment Rt thumb   History  Substance Use Topics  . Smoking status: Former Smoker -- 0.30 packs/day for 10 years    Types: Cigarettes    Quit date: 01/09/1998  . Smokeless tobacco: Not on file  . Alcohol Use: Not on file   No Known Allergies Family History  Problem Relation Age of Onset  . Arthritis Other   . Diabetes Other   . Hypertension Other   . Cancer Other     prostate  . Diabetes Mother   . Hypertension Mother   . Diabetes Maternal Grandfather         Past medical history, social, surgical and family history all reviewed in electronic medical record.   Review of Systems: No headache, visual changes, nausea, vomiting, diarrhea, constipation, dizziness, abdominal pain, skin rash, fevers, chills, night sweats, weight loss, swollen lymph nodes, body aches, joint swelling, muscle aches, chest pain, shortness of breath, mood changes.   Objective Blood pressure 148/96, pulse 88, height 5' 9.75" (1.772 m), weight 269 lb (122.018 kg), SpO2 98 %.    General: No apparent distress alert and oriented x3 mood and affect normal, dressed appropriately.  HEENT: Pupils equal, extraocular movements intact  Respiratory: Patient's speak in full sentences and does not appear short of breath  Cardiovascular: No lower extremity edema, non tender, no erythema  Skin: Warm dry intact with no signs of infection or rash on extremities or on axial skeleton.  Abdomen: Soft nontender  Neuro: Cranial nerves II through XII are intact, neurovascularly intact in all extremities with 2+ DTRs and 2+ pulses.  Lymph: No lymphadenopathy of posterior or anterior cervical chain or axillae bilaterally.  Gait normal with good balance and coordination.  MSK:  Non tender with full range of motion and good stability and symmetric strength and tone of shoulders, elbows, wrist, hip, knee and ankles bilaterally.  Elbow: Left Unremarkable to inspection. Limitation in supination pronation, less than 5 with minimal improvement from previous exam. Strength is full to all of the above directions Stable to varus, valgus stress. Negative moving valgus stress test. Decreased tenderness in the bicipital fossa Ulnar nerve does not sublux. Negative cubital tunnel Tinel's. Contralateral shoulder elbow unremarkable Mild improvement.       Impression and Recommendations:     This case required medical decision making of moderate complexity.

## 2015-04-27 NOTE — Progress Notes (Signed)
Pre visit review using our clinic review tool, if applicable. No additional management support is needed unless otherwise documented below in the visit note. 

## 2015-04-27 NOTE — Assessment & Plan Note (Signed)
Better but not fully no true light duty at work. At this point patient will do no lifting for the next week. We discussed different treatment options and patient has elected to try prednisone to aid in decreasing this information significantly. In addition of this patient will start on the nitroglycerin patch and was warned of potential side effects. We discussed continuing the icing, compression, as well as the home exercises. Patient will follow-up in 2 weeks for further evaluation. Patient in the interim from 1 week off-duty we'll do a week trial of full duty and we will discuss again in 2 weeks to make sure we can release patient to full duty. Patient has no significant improvement I do think that advance imaging is warranted due to this being patient's likelihood.  Spent  25 minutes with patient face-to-face and had greater than 50% of counseling including as described above in assessment and plan.

## 2015-04-27 NOTE — Patient Instructions (Signed)
Good to see you Continue the icing Continue the exercises No lifting for next week then week a full duty and see me in 2 weeks Prednisone daily for 5 days.  Nitroglycerin Protocol   Apply 1/4 nitroglycerin patch to affected area daily.  Change position of patch within the affected area every 24 hours.  You may experience a headache during the first 1-2 weeks of using the patch, these should subside.  If you experience headaches after beginning nitroglycerin patch treatment, you may take your preferred over the counter pain reliever.  Another side effect of the nitroglycerin patch is skin irritation or rash related to patch adhesive.  Please notify our office if you develop more severe headaches or rash, and stop the patch.  Tendon healing with nitroglycerin patch may require 12 to 24 weeks depending on the extent of injury.  Men should not use if taking Viagra, Cialis, or Levitra.   Do not use if you have migraines or rosacea.   See me again in 2 weeks.

## 2015-05-04 ENCOUNTER — Encounter: Payer: Self-pay | Admitting: Family Medicine

## 2015-05-04 ENCOUNTER — Ambulatory Visit (INDEPENDENT_AMBULATORY_CARE_PROVIDER_SITE_OTHER): Payer: BLUE CROSS/BLUE SHIELD | Admitting: Family Medicine

## 2015-05-04 VITALS — BP 162/100 | HR 90 | Temp 98.6°F | Ht 69.75 in | Wt 264.6 lb

## 2015-05-04 DIAGNOSIS — I10 Essential (primary) hypertension: Secondary | ICD-10-CM | POA: Diagnosis not present

## 2015-05-04 MED ORDER — HYDROCHLOROTHIAZIDE 25 MG PO TABS: 25.0000 mg | ORAL_TABLET | Freq: Every day | ORAL | Status: DC

## 2015-05-04 NOTE — Progress Notes (Signed)
HPI:  HTN: -he has a hx of HTN -he was on losartan in the past but stopped it because he was doing better -but gained weight and BP went back up -BP at work fit test today 160/110 and not allowed to proceed until BP corrected -he thinks the losartan made him bloated -denies: CP, SOB, HA, DOE, HA, vision changes  ROS: See pertinent positives and negatives per HPI.  Past Medical History  Diagnosis Date  . GERD 07/19/2009  . Prepatellar bursitis 07/19/2009  . GANGLION CYST 07/19/2009  . ELEVATED BLOOD PRESSURE 07/19/2009    Past Surgical History  Procedure Laterality Date  . Thumb arthroscopy  1993    re-attachment Rt thumb    Family History  Problem Relation Age of Onset  . Arthritis Other   . Diabetes Other   . Hypertension Other   . Cancer Other     prostate  . Diabetes Mother   . Hypertension Mother   . Diabetes Maternal Grandfather     History   Social History  . Marital Status: Married    Spouse Name: N/A  . Number of Children: N/A  . Years of Education: N/A   Social History Main Topics  . Smoking status: Former Smoker -- 0.30 packs/day for 10 years    Types: Cigarettes    Quit date: 01/09/1998  . Smokeless tobacco: Not on file  . Alcohol Use: Not on file  . Drug Use: Not on file  . Sexual Activity: Not on file   Other Topics Concern  . None   Social History Narrative     Current outpatient prescriptions:  .  nitroGLYCERIN (NITRODUR - DOSED IN MG/24 HR) 0.2 mg/hr patch, 1/4 patch daily, Disp: 30 patch, Rfl: 1 .  hydrochlorothiazide (HYDRODIURIL) 25 MG tablet, Take 1 tablet (25 mg total) by mouth daily., Disp: 30 tablet, Rfl: 0  EXAM:  Filed Vitals:   05/04/15 1412  BP: 162/100  Pulse: 90  Temp: 98.6 F (37 C)    Body mass index is 38.22 kg/(m^2).  GENERAL: vitals reviewed and listed above, alert, oriented, appears well hydrated and in no acute distress  HEENT: atraumatic, conjunttiva clear, no obvious abnormalities on inspection of  external nose and ears  NECK: no obvious masses on inspection  LUNGS: clear to auscultation bilaterally, no wheezes, rales or rhonchi, good air movement  CV: HRRR, no peripheral edema  MS: moves all extremities without noticeable abnormality  PSYCH: pleasant and cooperative, no obvious depression or anxiety  ASSESSMENT AND PLAN:  Discussed the following assessment and plan:  Essential hypertension - Plan: hydrochlorothiazide (HYDRODIURIL) 25 MG tablet  -after discussion he opted to start hctz (he did not like the way losartan made him feel) -he needs to do fit test for work and needs approval once BP down -follow up with PCP in 1 week to recheck -healthy lifestyle advised -Patient advised to return or notify a doctor immediately if symptoms worsen or persist or new concerns arise.  Patient Instructions  BEFORE YOU LEAVE: -schedule follow up with Dr. Caryl Never in 1 week  Start the new medication (hydrochlorthiazide) today and take once daily  We recommend the following healthy lifestyle measures: - eat a healthy diet consisting of lots of vegetables, fruits, beans, nuts, seeds, healthy meats such as white chicken and fish and whole grains.  - avoid fried foods, fast food, processed foods, sodas, red meet and other fattening foods.  - get a least 150 minutes of aerobic exercise per week  once blood pressure improved       KIM, HANNAH R.

## 2015-05-04 NOTE — Progress Notes (Signed)
Pre visit review using our clinic review tool, if applicable. No additional management support is needed unless otherwise documented below in the visit note. 

## 2015-05-04 NOTE — Patient Instructions (Signed)
BEFORE YOU LEAVE: -schedule follow up with Dr. Caryl NeverBurchette in 1 week  Start the new medication (hydrochlorthiazide) today and take once daily  We recommend the following healthy lifestyle measures: - eat a healthy diet consisting of lots of vegetables, fruits, beans, nuts, seeds, healthy meats such as white chicken and fish and whole grains.  - avoid fried foods, fast food, processed foods, sodas, red meet and other fattening foods.  - get a least 150 minutes of aerobic exercise per week once blood pressure improved

## 2015-05-07 ENCOUNTER — Encounter: Payer: Self-pay | Admitting: Family Medicine

## 2015-05-07 ENCOUNTER — Ambulatory Visit (INDEPENDENT_AMBULATORY_CARE_PROVIDER_SITE_OTHER): Payer: BLUE CROSS/BLUE SHIELD | Admitting: Family Medicine

## 2015-05-07 VITALS — BP 154/90 | HR 90 | Temp 98.6°F | Wt 267.0 lb

## 2015-05-07 DIAGNOSIS — I10 Essential (primary) hypertension: Secondary | ICD-10-CM | POA: Diagnosis not present

## 2015-05-07 NOTE — Progress Notes (Signed)
Pre visit review using our clinic review tool, if applicable. No additional management support is needed unless otherwise documented below in the visit note. 

## 2015-05-07 NOTE — Progress Notes (Signed)
   Subjective:    Patient ID: Benjamin Odom, male    DOB: 04/03/1971, 44 y.o.   MRN: [id number]  HPI Patient seen for follow-up regarding hypertension. He has past history of hypertension and had lost some weight and blood pressure came down to normal and he quit taking losartan sometime back. Recently went for work fit testing and blood pressure was significantly elevated at 160/110. He was seen here just a few days ago and started on HCTZ 25 mg daily. Tolerating with no side effects. No headaches. No dizziness. No chest pains.  Past Medical History  Diagnosis Date  . GERD 07/19/2009  . Prepatellar bursitis 07/19/2009  . GANGLION CYST 07/19/2009  . ELEVATED BLOOD PRESSURE 07/19/2009   Past Surgical History  Procedure Laterality Date  . Thumb arthroscopy  1993    re-attachment Rt thumb    reports that he quit smoking about 17 years ago. His smoking use included Cigarettes. He has a 3 pack-year smoking history. He does not have any smokeless tobacco history on file. His alcohol and drug histories are not on file. family history includes Arthritis in his other; Cancer in his other; Diabetes in his maternal grandfather, mother, and other; Hypertension in his mother and other. No Known Allergies    Review of Systems  Constitutional: Negative for fatigue.  Eyes: Negative for visual disturbance.  Respiratory: Negative for cough, chest tightness and shortness of breath.   Cardiovascular: Negative for chest pain, palpitations and leg swelling.  Neurological: Negative for dizziness, syncope, weakness, light-headedness and headaches.       Objective:   Physical Exam  Constitutional: He is oriented to person, place, and time. He appears well-developed and well-nourished.  HENT:  Right Ear: External ear normal.  Left Ear: External ear normal.  Mouth/Throat: Oropharynx is clear and moist.  Eyes: Pupils are equal, round, and reactive to light.  Neck: Neck supple. No thyromegaly present.    Cardiovascular: Normal rate and regular rhythm.   Pulmonary/Chest: Effort normal and breath sounds normal. No respiratory distress. He has no wheezes. He has no rales.  Musculoskeletal: He exhibits no edema.  Neurological: He is alert and oriented to person, place, and time.          Assessment & Plan:  Hypertension. Slightly improved today. Repeat left arm seated large cuff 154/90. Recommend DASH diet. Handout given. Lose some weight. Establish more consistent aerobic exercise. High potassium diet. Sodium reduction. Reassess blood pressure 3 weeks and check basic metabolic panel then. Forms completed for work fit testing

## 2015-05-07 NOTE — Patient Instructions (Addendum)
Potassium Content of Foods Potassium is a mineral found in many foods and drinks. It helps keep fluids and minerals balanced in your body and affects how steadily your heart beats. Potassium also helps control your blood pressure and keep your muscles and nervous system healthy. Certain health conditions and medicines may change the balance of potassium in your body. When this happens, you can help balance your level of potassium through the foods that you do or do not eat. Your health care provider or dietitian may recommend an amount of potassium that you should have each day. The following lists of foods provide the amount of potassium (in parentheses) per serving in each item. HIGH IN POTASSIUM  The following foods and beverages have 200 mg or more of potassium per serving:  Apricots, 2 raw or 5 dry (200 mg).  Artichoke, 1 medium (345 mg).  Avocado, raw,  each (245 mg).  Banana, 1 medium (425 mg).  Beans, lima, or baked beans, canned,  cup (280 mg).  Beans, white, canned,  cup (595 mg).  Beef roast, 3 oz (320 mg).  Beef, ground, 3 oz (270 mg).  Beets, raw or cooked,  cup (260 mg).  Bran muffin, 2 oz (300 mg).  Broccoli,  cup (230 mg).  Brussels sprouts,  cup (250 mg).  Cantaloupe,  cup (215 mg).  Cereal, 100% bran,  cup (200-400 mg).  Cheeseburger, single, fast food, 1 each (225-400 mg).  Chicken, 3 oz (220 mg).  Clams, canned, 3 oz (535 mg).  Crab, 3 oz (225 mg).  Dates, 5 each (270 mg).  Dried beans and peas,  cup (300-475 mg).  Figs, dried, 2 each (260 mg).  Fish: halibut, tuna, cod, snapper, 3 oz (480 mg).  Fish: salmon, haddock, swordfish, perch, 3 oz (300 mg).  Fish, tuna, canned 3 oz (200 mg).  French fries, fast food, 3 oz (470 mg).  Granola with fruit and nuts,  cup (200 mg).  Grapefruit juice,  cup (200 mg).  Greens, beet,  cup (655 mg).  Honeydew melon,  cup (200 mg).  Kale, raw, 1 cup (300 mg).  Kiwi, 1 medium (240  mg).  Kohlrabi, rutabaga, parsnips,  cup (280 mg).  Lentils,  cup (365 mg).  Mango, 1 each (325 mg).  Milk, chocolate, 1 cup (420 mg).  Milk: nonfat, low-fat, whole, buttermilk, 1 cup (350-380 mg).  Molasses, 1 Tbsp (295 mg).  Mushrooms,  cup (280) mg.  Nectarine, 1 each (275 mg).  Nuts: almonds, peanuts, hazelnuts, [place], cashew, mixed, 1 oz (200 mg).  Nuts, pistachios, 1 oz (295 mg).  Orange, 1 each (240 mg).  Orange juice,  cup (235 mg).  Papaya, medium,  fruit (390 mg).  Peanut butter, chunky, 2 Tbsp (240 mg).  Peanut butter, smooth, 2 Tbsp (210 mg).  Pear, 1 medium (200 mg).  Pomegranate, 1 whole (400 mg).  Pomegranate juice,  cup (215 mg).  Pork, 3 oz (350 mg).  Potato chips, salted, 1 oz (465 mg).  Potato, baked with skin, 1 medium (925 mg).  Potatoes, boiled,  cup (255 mg).  Potatoes, mashed,  cup (330 mg).  Prune juice,  cup (370 mg).  Prunes, 5 each (305 mg).  Pudding, chocolate,  cup (230 mg).  Pumpkin, canned,  cup (250 mg).  Raisins, seedless,  cup (270 mg).  Seeds, sunflower or pumpkin, 1 oz (240 mg).  Soy milk, 1 cup (300 mg).  Spinach,  cup (420 mg).  Spinach, canned,  cup (370 mg).    Sweet potato, baked with skin, 1 medium (450 mg).  Swiss chard,  cup (480 mg).  Tomato or vegetable juice,  cup (275 mg).  Tomato sauce or puree,  cup (400-550 mg).  Tomato, raw, 1 medium (290 mg).  Tomatoes, canned,  cup (200-300 mg).  Turkey, 3 oz (250 mg).  Wheat germ, 1 oz (250 mg).  Winter squash,  cup (250 mg).  Yogurt, plain or fruited, 6 oz (260-435 mg).  Zucchini,  cup (220 mg). MODERATE IN POTASSIUM The following foods and beverages have 50-200 mg of potassium per serving:  Apple, 1 each (150 mg).  Apple juice,  cup (150 mg).  Applesauce,  cup (90 mg).  Apricot nectar,  cup (140 mg).  Asparagus, small spears,  cup or 6 spears (155 mg).  Bagel, cinnamon raisin, 1 each (130 mg).  Bagel,  egg or plain, 4 in., 1 each (70 mg).  Beans, green,  cup (90 mg).  Beans, yellow,  cup (190 mg).  Beer, regular, 12 oz (100 mg).  Beets, canned,  cup (125 mg).  Blackberries,  cup (115 mg).  Blueberries,  cup (60 mg).  Bread, whole wheat, 1 slice (70 mg).  Broccoli, raw,  cup (145 mg).  Cabbage,  cup (150 mg).  Carrots, cooked or raw,  cup (180 mg).  Cauliflower, raw,  cup (150 mg).  Celery, raw,  cup (155 mg).  Cereal, bran flakes, cup (120-150 mg).  Cheese, cottage,  cup (110 mg).  Cherries, 10 each (150 mg).  Chocolate, 1 oz bar (165 mg).  Coffee, brewed 6 oz (90 mg).  Corn,  cup or 1 ear (195 mg).  Cucumbers,  cup (80 mg).  Egg, large, 1 each (60 mg).  Eggplant,  cup (60 mg).  Endive, raw, cup (80 mg).  English muffin, 1 each (65 mg).  Fish, orange roughy, 3 oz (150 mg).  Frankfurter, beef or pork, 1 each (75 mg).  Fruit cocktail,  cup (115 mg).  Grape juice,  cup (170 mg).  Grapefruit,  fruit (175 mg).  Grapes,  cup (155 mg).  Greens: kale, turnip, collard,  cup (110-150 mg).  Ice cream or frozen yogurt, chocolate,  cup (175 mg).  Ice cream or frozen yogurt, vanilla,  cup (120-150 mg).  Lemons, limes, 1 each (80 mg).  Lettuce, all types, 1 cup (100 mg).  Mixed vegetables,  cup (150 mg).  Mushrooms, raw,  cup (110 mg).  Nuts: walnuts, pecans, or macadamia, 1 oz (125 mg).  Oatmeal,  cup (80 mg).  Okra,  cup (110 mg).  Onions, raw,  cup (120 mg).  Peach, 1 each (185 mg).  Peaches, canned,  cup (120 mg).  Pears, canned,  cup (120 mg).  Peas, green, frozen,  cup (90 mg).  Peppers, green,  cup (130 mg).  Peppers, red,  cup (160 mg).  Pineapple juice,  cup (165 mg).  Pineapple, fresh or canned,  cup (100 mg).  Plums, 1 each (105 mg).  Pudding, vanilla,  cup (150 mg).  Raspberries,  cup (90 mg).  Rhubarb,  cup (115 mg).  Rice, wild,  cup (80 mg).  Shrimp, 3 oz (155  mg).  Spinach, raw, 1 cup (170 mg).  Strawberries,  cup (125 mg).  Summer squash  cup (175-200 mg).  Swiss chard, raw, 1 cup (135 mg).  Tangerines, 1 each (140 mg).  Tea, brewed, 6 oz (65 mg).  Turnips,  cup (140 mg).  Watermelon,  cup (85 mg).  Wine, red, table,   5 oz (180 mg).  Wine, white, table, 5 oz (100 mg). LOW IN POTASSIUM The following foods and beverages have less than 50 mg of potassium per serving.  Bread, white, 1 slice (30 mg).  Carbonated beverages, 12 oz (less than 5 mg).  Cheese, 1 oz (20-30 mg).  Cranberries,  cup (45 mg).  Cranberry juice cocktail,  cup (20 mg).  Fats and oils, 1 Tbsp (less than 5 mg).  Hummus, 1 Tbsp (32 mg).  Nectar: papaya, mango, or pear,  cup (35 mg).  Rice, white or brown,  cup (50 mg).  Spaghetti or macaroni,  cup cooked (30 mg).  Tortilla, flour or corn, 1 each (50 mg).  Waffle, 4 in., 1 each (50 mg).  Water chestnuts,  cup (40 mg). Document Released: 07/04/2005 Document Revised: 11/25/2013 Document Reviewed: 10/17/2013 [facility] Patient Information 2015 [place], [place]. This information is not intended to replace advice given to you by your health care provider. Make sure you discuss any questions you have with your health care provider.     DASH Eating Plan DASH stands for "Dietary Approaches to Stop Hypertension." The DASH eating plan is a healthy eating plan that has been shown to reduce high blood pressure (hypertension). Additional health benefits may include reducing the risk of type 2 diabetes mellitus, heart disease, and stroke. The DASH eating plan may also help with weight loss. WHAT DO I NEED TO KNOW ABOUT THE DASH EATING PLAN? For the DASH eating plan, you will follow these general guidelines:  Choose foods with a percent daily value for sodium of less than 5% (as listed on the food label).  Use salt-free seasonings or herbs instead of table salt or sea salt.  Check with your health care  provider or pharmacist before using salt substitutes.  Eat lower-sodium products, often labeled as "lower sodium" or "no salt added."  Eat fresh foods.  Eat more vegetables, fruits, and low-fat dairy products.  Choose whole grains. Look for the word "whole" as the first word in the ingredient list.  Choose fish and skinless chicken or [place] more often than red meat. Limit fish, poultry, and meat to 6 oz (170 g) each day.  Limit sweets, desserts, sugars, and sugary drinks.  Choose heart-healthy fats.  Limit cheese to 1 oz (28 g) per day.  Eat more home-cooked food and less restaurant, buffet, and fast food.  Limit fried foods.  Cook foods using methods other than frying.  Limit canned vegetables. If you do use them, rinse them well to decrease the sodium.  When eating at a restaurant, ask that your food be prepared with less salt, or no salt if possible. WHAT FOODS CAN I EAT? Seek help from a dietitian for individual calorie needs. Grains Whole grain or whole wheat bread. Brown rice. Whole grain or whole wheat pasta. Quinoa, bulgur, and whole grain cereals. Low-sodium cereals. Corn or whole wheat flour tortillas. Whole grain cornbread. Whole grain crackers. Low-sodium crackers. Vegetables Fresh or frozen vegetables (raw, steamed, roasted, or grilled). Low-sodium or reduced-sodium tomato and vegetable juices. Low-sodium or reduced-sodium tomato sauce and paste. Low-sodium or reduced-sodium canned vegetables.  Fruits All fresh, canned (in natural juice), or frozen fruits. Meat and Other Protein Products Ground beef (85% or leaner), grass-fed beef, or beef trimmed of fat. Skinless chicken or [place]. Ground chicken or [place]. Pork trimmed of fat. All fish and seafood. Eggs. Dried beans, peas, or lentils. Unsalted nuts and seeds. Unsalted canned beans. Dairy Low-fat dairy products, such as  skim or 1% milk, 2% or reduced-fat cheeses, low-fat ricotta or cottage cheese, or plain low-fat  yogurt. Low-sodium or reduced-sodium cheeses. Fats and Oils Tub margarines without trans fats. Light or reduced-fat mayonnaise and salad dressings (reduced sodium). Avocado. Safflower, olive, or canola oils. Natural peanut or almond butter. Other Unsalted popcorn and pretzels. The items listed above may not be a complete list of recommended foods or beverages. Contact your dietitian for more options. WHAT FOODS ARE NOT RECOMMENDED? Grains White bread. White pasta. White rice. Refined cornbread. Bagels and croissants. Crackers that contain trans fat. Vegetables Creamed or fried vegetables. Vegetables in a cheese sauce. Regular canned vegetables. Regular canned tomato sauce and paste. Regular tomato and vegetable juices. Fruits Dried fruits. Canned fruit in light or heavy syrup. Fruit juice. Meat and Other Protein Products Fatty cuts of meat. Ribs, chicken wings, bacon, sausage, bologna, salami, chitterlings, fatback, hot dogs, bratwurst, and packaged luncheon meats. Salted nuts and seeds. Canned beans with salt. Dairy Whole or 2% milk, cream, half-and-half, and cream cheese. Whole-fat or sweetened yogurt. Full-fat cheeses or blue cheese. Nondairy creamers and whipped toppings. Processed cheese, cheese spreads, or cheese curds. Condiments Onion and garlic salt, seasoned salt, table salt, and sea salt. Canned and packaged gravies. Worcestershire sauce. Tartar sauce. Barbecue sauce. Teriyaki sauce. Soy sauce, including reduced sodium. Steak sauce. Fish sauce. Oyster sauce. Cocktail sauce. Horseradish. Ketchup and mustard. Meat flavorings and tenderizers. Bouillon cubes. Hot sauce. Tabasco sauce. Marinades. Taco seasonings. Relishes. Fats and Oils Butter, stick margarine, lard, shortening, ghee, and bacon fat. Coconut, palm kernel, or palm oils. Regular salad dressings. Other Pickles and olives. Salted popcorn and pretzels. The items listed above may not be a complete list of foods and beverages to  avoid. Contact your dietitian for more information. WHERE CAN I FIND MORE INFORMATION? National Heart, Lung, and Blood Institute: [employer] Document Released: 11/09/2011 Document Revised: 04/06/2014 Document Reviewed: 09/24/2013 [facility] Patient Information 2015 [place], [place]. This information is not intended to replace advice given to you by your health care provider. Make sure you discuss any questions you have with your health care provider.   Try to lose some weight.

## 2015-05-11 ENCOUNTER — Encounter: Payer: Self-pay | Admitting: Family Medicine

## 2015-05-11 ENCOUNTER — Ambulatory Visit (INDEPENDENT_AMBULATORY_CARE_PROVIDER_SITE_OTHER): Payer: BLUE CROSS/BLUE SHIELD | Admitting: Family Medicine

## 2015-05-11 ENCOUNTER — Encounter: Payer: Self-pay | Admitting: *Deleted

## 2015-05-11 VITALS — BP 136/84 | HR 111 | Ht 69.75 in | Wt 266.0 lb

## 2015-05-11 DIAGNOSIS — G5612 Other lesions of median nerve, left upper limb: Secondary | ICD-10-CM

## 2015-05-11 NOTE — Progress Notes (Signed)
Pre visit review using our clinic review tool, if applicable. No additional management support is needed unless otherwise documented below in the visit note. 

## 2015-05-11 NOTE — Progress Notes (Signed)
  Tawana ScaleZach Adisynn Suleiman D.O. [facility] [address] [place], [place] [address] Phone: [phone] Subjective:    CC: Left elbow pain follow-up  FAO:[id number]:Subjective Benjamin CharBrian C Odom is a 44 y.o. 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 Patient was given home exercises,icing protocol, and oral anti-inflammatory's. Patient was on light duty and was started on nitroglycerin patches. Patient states he is 90% better. Still mild tightness but no pain. Patient continues to nitroglycerin with no side effects.  Past Medical History  Diagnosis Date  . GERD 07/19/2009  . Prepatellar bursitis 07/19/2009  . GANGLION CYST 07/19/2009  . ELEVATED BLOOD PRESSURE 07/19/2009   Past Surgical History  Procedure Laterality Date  . Thumb arthroscopy  1993    re-attachment Rt thumb   History  Substance Use Topics  . Smoking status: Former Smoker -- 0.30 packs/day for 10 years    Types: Cigarettes    Quit date: 01/09/1998  . Smokeless tobacco: Not on file  . Alcohol Use: Not on file   No Known Allergies Family History  Problem Relation Age of Onset  . Arthritis Other   . Diabetes Other   . Hypertension Other   . Cancer Other     prostate  . Diabetes Mother   . Hypertension Mother   . Diabetes Maternal Grandfather         Past medical history, social, surgical and family history all reviewed in electronic medical record.   Review of Systems: No headache, visual changes, nausea, vomiting, diarrhea, constipation, dizziness, abdominal pain, skin rash, fevers, chills, night sweats, weight loss, swollen lymph nodes, body aches, joint swelling, muscle aches, chest pain, shortness of breath, mood changes.   Objective Blood pressure 136/84, pulse 111, height 5' 9.75" (1.772 m), weight 266 lb (120.657 kg), SpO2 96 %.  General: No apparent distress alert and oriented x3 mood and affect normal, dressed appropriately.  HEENT:  Pupils equal, extraocular movements intact  Respiratory: Patient's speak in full sentences and does not appear short of breath  Cardiovascular: No lower extremity edema, non tender, no erythema  Skin: Warm dry intact with no signs of infection or rash on extremities or on axial skeleton.  Abdomen: Soft nontender  Neuro: Cranial nerves II through XII are intact, neurovascularly intact in all extremities with 2+ DTRs and 2+ pulses.  Lymph: No lymphadenopathy of posterior or anterior cervical chain or axillae bilaterally.  Gait normal with good balance and coordination.  MSK:  Non tender with full range of motion and good stability and symmetric strength and tone of shoulders, elbows, wrist, hip, knee and ankles bilaterally.  Elbow: Left Unremarkable to inspection. Full range of motion with no tenderness Strength is full to all of the above directions Stable to varus, valgus stress. Negative moving valgus stress test. Decreased tenderness in the bicipital fossa Ulnar nerve does not sublux. Negative cubital tunnel Tinel's. Contralateral shoulder elbow unremarkable Contralateral side symmetric       Impression and Recommendations:     This case required medical decision making of moderate complexity.

## 2015-05-11 NOTE — Patient Instructions (Signed)
Good to see you Ice 20 minutes 2 times daily. Usually after activity and before bed. Nitro 3 times a week.  Anti-inflammatory 3 days in a row when pain occurs Try to lift with palms down when possible Trial of full duty for 3 weeks and one more evaluation.

## 2015-05-11 NOTE — Assessment & Plan Note (Signed)
Patient overall is doing relatively well. We discussed icing regimen and home exercises. We discussed patient doing a trial for the next 3 weeks of full duty at work. Patient will monitor and continued conservative therapy. Patient will come back in 3 weeks to be released fully.

## 2015-05-28 ENCOUNTER — Encounter: Payer: Self-pay | Admitting: Family Medicine

## 2015-05-28 ENCOUNTER — Ambulatory Visit (INDEPENDENT_AMBULATORY_CARE_PROVIDER_SITE_OTHER): Payer: BLUE CROSS/BLUE SHIELD | Admitting: Family Medicine

## 2015-05-28 VITALS — BP 148/92 | HR 100 | Temp 98.3°F | Wt 268.0 lb

## 2015-05-28 DIAGNOSIS — I10 Essential (primary) hypertension: Secondary | ICD-10-CM

## 2015-05-28 MED ORDER — HYDROCHLOROTHIAZIDE 25 MG PO TABS: 25.0000 mg | ORAL_TABLET | Freq: Every day | ORAL | Status: DC

## 2015-05-28 NOTE — Progress Notes (Signed)
Pre visit review using our clinic review tool, if applicable. No additional management support is needed unless otherwise documented below in the visit note. 

## 2015-05-28 NOTE — Patient Instructions (Signed)
Monitor blood pressure and be in touch if consistently > 140/90 Lose some weight Establish more regular aerobic exercise.

## 2015-05-28 NOTE — Progress Notes (Signed)
   Subjective:    Patient ID: Benjamin Odom, male    DOB: 05-02-1971, 44 y.o.   MRN: [id number]  HPI  Patient seen for 3 week follow-up. Recent initiation HCTZ for hypertension. Not checking blood pressures at home. No headaches. No dizziness. He just started exercising this week at the gym. No weight changes yet. Has tried reducing his sodium intake. No regular alcohol use.  Past Medical History  Diagnosis Date  . GERD 07/19/2009  . Prepatellar bursitis 07/19/2009  . GANGLION CYST 07/19/2009  . ELEVATED BLOOD PRESSURE 07/19/2009   Past Surgical History  Procedure Laterality Date  . Thumb arthroscopy  1993    re-attachment Rt thumb    reports that he quit smoking about 17 years ago. His smoking use included Cigarettes. He has a 3 pack-year smoking history. He does not have any smokeless tobacco history on file. His alcohol and drug histories are not on file. family history includes Arthritis in his other; Cancer in his other; Diabetes in his maternal grandfather, mother, and other; Hypertension in his mother and other. No Known Allergies   Review of Systems  Constitutional: Negative for fatigue.  Eyes: Negative for visual disturbance.  Respiratory: Negative for cough, chest tightness and shortness of breath.   Cardiovascular: Negative for chest pain, palpitations and leg swelling.  Neurological: Negative for dizziness, syncope, weakness, light-headedness and headaches.       Objective:   Physical Exam  Constitutional: He is oriented to person, place, and time. He appears well-developed and well-nourished.  HENT:  Right Ear: External ear normal.  Left Ear: External ear normal.  Mouth/Throat: Oropharynx is clear and moist.  Eyes: Pupils are equal, round, and reactive to light.  Neck: Neck supple. No thyromegaly present.  Cardiovascular: Normal rate and regular rhythm.   Pulmonary/Chest: Effort normal and breath sounds normal. No respiratory distress. He has no wheezes. He has  no rales.  Musculoskeletal: He exhibits no edema.  Neurological: He is alert and oriented to person, place, and time.          Assessment & Plan:  Hypertension. Improved. Repeat blood pressure left arm seated large cuff 138/88. Continue weight loss efforts. Recommend regular aerobic exercise. Continue HCTZ. Check basic metabolic panel. Be in touch if blood pressures consistently greater than 140/90

## 2015-05-29 LAB — BASIC METABOLIC PANEL
BUN: 16 mg/dL (ref 6–23)
CHLORIDE: 102 meq/L (ref 96–112)
CO2: 28 meq/L (ref 19–32)
CREATININE: 1.05 mg/dL (ref 0.50–1.35)
Calcium: 9.9 mg/dL (ref 8.4–10.5)
Glucose, Bld: 83 mg/dL (ref 70–99)
Potassium: 4 mEq/L (ref 3.5–5.3)
Sodium: 141 mEq/L (ref 135–145)

## 2015-11-24 ENCOUNTER — Ambulatory Visit: Payer: BLUE CROSS/BLUE SHIELD | Admitting: Family Medicine

## 2015-12-20 ENCOUNTER — Encounter: Payer: Self-pay | Admitting: Family Medicine

## 2015-12-20 ENCOUNTER — Ambulatory Visit (INDEPENDENT_AMBULATORY_CARE_PROVIDER_SITE_OTHER): Payer: BLUE CROSS/BLUE SHIELD | Admitting: Family Medicine

## 2015-12-20 VITALS — BP 130/80 | HR 108 | Temp 98.8°F | Ht 69.75 in | Wt 268.8 lb

## 2015-12-20 DIAGNOSIS — I10 Essential (primary) hypertension: Secondary | ICD-10-CM | POA: Diagnosis not present

## 2015-12-20 NOTE — Progress Notes (Signed)
   Subjective:    Patient ID: Benjamin Odom, male    DOB: 10/23/1971, 45 y.o.   MRN: [id number]  HPI Follow-up hypertension. Currently on HCTZ 25 mg once daily. Blood pressures have been well controlled-although recently he has had some problems with his home blood pressure cuff. No headaches. No dizziness. Tries to follow high potassium diet. Currently not exercising.  Past Medical History  Diagnosis Date  . GERD 07/19/2009  . Prepatellar bursitis 07/19/2009  . GANGLION CYST 07/19/2009  . ELEVATED BLOOD PRESSURE 07/19/2009   Past Surgical History  Procedure Laterality Date  . Thumb arthroscopy  1993    re-attachment Rt thumb    reports that he quit smoking about 17 years ago. His smoking use included Cigarettes. He has a 3 pack-year smoking history. He does not have any smokeless tobacco history on file. His alcohol and drug histories are not on file. family history includes Arthritis in his other; Cancer in his other; Diabetes in his maternal grandfather, mother, and other; Hypertension in his mother and other. No Known Allergies    Review of Systems  Constitutional: Negative for fatigue.  Eyes: Negative for visual disturbance.  Respiratory: Negative for cough, chest tightness and shortness of breath.   Cardiovascular: Negative for chest pain, palpitations and leg swelling.  Neurological: Negative for dizziness, syncope, weakness, light-headedness and headaches.       Objective:   Physical Exam  Constitutional: He appears well-developed and well-nourished.  Cardiovascular: Normal rate and regular rhythm.  Exam reveals no gallop.   No murmur heard. Pulmonary/Chest: Effort normal and breath sounds normal. No respiratory distress. He has no wheezes. He has no rales.  Musculoskeletal: He exhibits no edema.          Assessment & Plan:  Hypertension. Stable and well controlled. Continue HCTZ 25 mg once daily. Recheck labs including basic metabolic panel at follow-up. He is  encouraged to start more consistent exercise and lose some weight

## 2015-12-20 NOTE — Progress Notes (Signed)
Pre visit review using our clinic review tool, if applicable. No additional management support is needed unless otherwise documented below in the visit note. 

## 2016-06-21 ENCOUNTER — Ambulatory Visit: Payer: BLUE CROSS/BLUE SHIELD | Admitting: Family Medicine

## 2016-06-21 ENCOUNTER — Ambulatory Visit (INDEPENDENT_AMBULATORY_CARE_PROVIDER_SITE_OTHER): Payer: BLUE CROSS/BLUE SHIELD | Admitting: Family Medicine

## 2016-06-21 ENCOUNTER — Encounter: Payer: Self-pay | Admitting: Family Medicine

## 2016-06-21 VITALS — BP 110/80 | HR 91 | Temp 98.2°F | Ht 69.75 in | Wt 263.0 lb

## 2016-06-21 DIAGNOSIS — I10 Essential (primary) hypertension: Secondary | ICD-10-CM | POA: Diagnosis not present

## 2016-06-21 NOTE — Progress Notes (Signed)
Subjective:     Patient ID: Benjamin Odom, male   DOB: 12/17/1970, 45 y.o.   MRN: [id number]  HPI Patient seen for follow-up hypertension. Staying very busy with recent move to a new home and also has a 46 year old daughter and 46-year-old son. Very little time to exercise. He has made some dietary changes and has lost 5 pounds since last visit Remains on HCTZ 25 mg daily for hypertension. Not monitoring blood pressures regularly. Nonsmoker. No alcohol use.  Past Medical History  Diagnosis Date  . GERD 07/19/2009  . Prepatellar bursitis 07/19/2009  . GANGLION CYST 07/19/2009  . ELEVATED BLOOD PRESSURE 07/19/2009   Past Surgical History  Procedure Laterality Date  . Thumb arthroscopy  1993    re-attachment Rt thumb    reports that he quit smoking about 18 years ago. His smoking use included Cigarettes. He has a 3 pack-year smoking history. He does not have any smokeless tobacco history on file. His alcohol and drug histories are not on file. family history includes Arthritis in his other; Cancer in his other; Diabetes in his maternal grandfather, mother, and other; Hypertension in his mother and other. No Known Allergies   Review of Systems  Constitutional: Negative for fatigue.  Eyes: Negative for visual disturbance.  Respiratory: Negative for cough, chest tightness and shortness of breath.   Cardiovascular: Negative for chest pain, palpitations and leg swelling.  Neurological: Negative for dizziness, syncope, weakness, light-headedness and headaches.       Objective:   Physical Exam  Constitutional: He appears well-developed and well-nourished.  Neck: Neck supple. No thyromegaly present.  Cardiovascular: Normal rate and regular rhythm.  Exam reveals no gallop.   Pulmonary/Chest: Effort normal and breath sounds normal. No respiratory distress. He has no wheezes. He has no rales.  Musculoskeletal: He exhibits no edema.  Lymphadenopathy:    He has no cervical adenopathy.        Assessment:     Hypertension. Well controlled.    Plan:     -continue weight loss efforts -Try to establish more consistent aerobic exercise -Try reducing HCTZ to 12.5 mg daily. -After 3 weeks, if blood pressures consistently less than 130/90 with try discontinuing and monitor blood pressure closely -Suggest follow-up in 6 months for complete physical  Benjamin CoveyBruce W Larya Charpentier MD  [facility]

## 2016-06-21 NOTE — Patient Instructions (Signed)
Try reducing the HCTZ to one half tablet a day Monitor blood pressure and in 3 weeks if BP < 130/90 then discontinue Be in touch if BP consistently 140/90. Consider physical at next follow up. Continue with weight loss efforts.

## 2016-06-21 NOTE — Progress Notes (Signed)
Pre visit review using our clinic review tool, if applicable. No additional management support is needed unless otherwise documented below in the visit note. 

## 2016-08-28 DIAGNOSIS — Z23 Encounter for immunization: Secondary | ICD-10-CM | POA: Diagnosis not present

## 2016-12-18 ENCOUNTER — Other Ambulatory Visit: Payer: BLUE CROSS/BLUE SHIELD

## 2016-12-25 ENCOUNTER — Encounter: Payer: BLUE CROSS/BLUE SHIELD | Admitting: Family Medicine

## 2016-12-25 ENCOUNTER — Other Ambulatory Visit (INDEPENDENT_AMBULATORY_CARE_PROVIDER_SITE_OTHER): Payer: BLUE CROSS/BLUE SHIELD

## 2016-12-25 DIAGNOSIS — Z0000 Encounter for general adult medical examination without abnormal findings: Secondary | ICD-10-CM

## 2016-12-25 LAB — BASIC METABOLIC PANEL
BUN: 14 mg/dL (ref 6–23)
CALCIUM: 9.7 mg/dL (ref 8.4–10.5)
CHLORIDE: 103 meq/L (ref 96–112)
CO2: 29 mEq/L (ref 19–32)
CREATININE: 1.01 mg/dL (ref 0.40–1.50)
GFR: 101.89 mL/min (ref >60.00)
Glucose, Bld: 108 mg/dL — ABNORMAL HIGH (ref 70–99)
Potassium: 4.5 mEq/L (ref 3.5–5.1)
Sodium: 139 mEq/L (ref 135–145)

## 2016-12-25 LAB — TSH: TSH: 0.74 u[IU]/mL (ref 0.35–4.50)

## 2016-12-25 LAB — CBC WITH DIFFERENTIAL/PLATELET
BASOS PCT: 0.3 % (ref 0.0–3.0)
Basophils Absolute: 0 10*3/uL (ref 0.0–0.1)
Eosinophils Absolute: 0 10*3/uL (ref 0.0–0.7)
Eosinophils Relative: 0.3 % (ref 0.0–5.0)
HCT: 41.6 % (ref 39.0–52.0)
HEMOGLOBIN: 14 g/dL (ref 13.0–17.0)
Lymphocytes Relative: 20.5 % (ref 12.0–46.0)
Lymphs Abs: 2.4 10*3/uL (ref 0.7–4.0)
MCHC: 33.6 g/dL (ref 30.0–36.0)
MCV: 86.3 fl (ref 78.0–100.0)
MONO ABS: 0.7 10*3/uL (ref 0.1–1.0)
Monocytes Relative: 6.4 % (ref 3.0–12.0)
Neutro Abs: 8.3 10*3/uL — ABNORMAL HIGH (ref 1.4–7.7)
Neutrophils Relative %: 72.5 % (ref 43.0–77.0)
Platelets: 478 10*3/uL — ABNORMAL HIGH (ref 150.0–400.0)
RBC: 4.82 Mil/uL (ref 4.22–5.81)
RDW: 14.4 % (ref 11.5–15.5)
WBC: 11.5 10*3/uL — AB (ref 4.0–10.5)

## 2016-12-25 LAB — PSA: PSA: 0.18 ng/mL (ref 0.10–4.00)

## 2016-12-25 LAB — LIPID PANEL
CHOLESTEROL: 187 mg/dL (ref 0–200)
HDL: 36.7 mg/dL — ABNORMAL LOW (ref >39.00)
LDL Cholesterol: 134 mg/dL — ABNORMAL HIGH (ref 0–99)
NONHDL: 150.21
Total CHOL/HDL Ratio: 5
Triglycerides: 82 mg/dL (ref 0.0–149.0)
VLDL: 16.4 mg/dL (ref 0.0–40.0)

## 2016-12-25 LAB — HEPATIC FUNCTION PANEL
ALT: 17 U/L (ref 0–53)
AST: 13 U/L (ref 0–37)
Albumin: 4.2 g/dL (ref 3.5–5.2)
Alkaline Phosphatase: 68 U/L (ref 39–117)
BILIRUBIN DIRECT: 0.1 mg/dL (ref 0.0–0.3)
BILIRUBIN TOTAL: 0.5 mg/dL (ref 0.2–1.2)
Total Protein: 7.2 g/dL (ref 6.0–8.3)

## 2016-12-29 ENCOUNTER — Ambulatory Visit (INDEPENDENT_AMBULATORY_CARE_PROVIDER_SITE_OTHER): Payer: BLUE CROSS/BLUE SHIELD | Admitting: Family Medicine

## 2016-12-29 ENCOUNTER — Encounter: Payer: Self-pay | Admitting: Family Medicine

## 2016-12-29 VITALS — BP 150/100 | HR 93 | Ht 69.75 in | Wt 277.3 lb

## 2016-12-29 DIAGNOSIS — Z0000 Encounter for general adult medical examination without abnormal findings: Secondary | ICD-10-CM

## 2016-12-29 DIAGNOSIS — Z23 Encounter for immunization: Secondary | ICD-10-CM

## 2016-12-29 NOTE — Progress Notes (Signed)
Pre visit review using our clinic review tool, if applicable. No additional management support is needed unless otherwise documented below in the visit note. 

## 2016-12-29 NOTE — Progress Notes (Signed)
Subjective:     Patient ID: Benjamin Odom, male   DOB: 1971-02-03, 46 y.o.   MRN: [id number]  HPI Patient seen for physical exam. He has unfortunately gained about 14 pounds since last visit. Poor compliance with diet and exercise. Brother recently diagnosed with type 2 diabetes. His mother also has type 2 diabetes. Last tetanus over 10 years ago. He weaned himself off of HCTZ since last visit. Not monitoring blood pressures regularly. No chest pains. No headaches. No dizziness. No peripheral edema issues.  Past Medical History:  Diagnosis Date  . ELEVATED BLOOD PRESSURE 07/19/2009  . GANGLION CYST 07/19/2009  . GERD 07/19/2009  . Prepatellar bursitis 07/19/2009   Past Surgical History:  Procedure Laterality Date  . THUMB ARTHROSCOPY  1993   re-attachment Rt thumb    reports that he quit smoking about 18 years ago. His smoking use included Cigarettes. He has a 3.00 pack-year smoking history. He has never used smokeless tobacco. He reports that he does not drink alcohol or use drugs. family history includes Arthritis in his other; Cancer in his other; Diabetes in his brother, maternal grandfather, mother, and other; Hypertension in his mother and other. No Known Allergies   Review of Systems  Constitutional: Negative for activity change, appetite change, fatigue and fever.  HENT: Negative for congestion, ear pain and trouble swallowing.   Eyes: Negative for pain and visual disturbance.  Respiratory: Negative for cough, shortness of breath and wheezing.   Cardiovascular: Negative for chest pain and palpitations.  Gastrointestinal: Negative for abdominal distention, abdominal pain, blood in stool, constipation, diarrhea, nausea, rectal pain and vomiting.  Genitourinary: Negative for dysuria, hematuria and testicular pain.  Musculoskeletal: Negative for arthralgias and joint swelling.  Skin: Negative for rash.  Neurological: Negative for dizziness, syncope and headaches.  Hematological:  Negative for adenopathy.  Psychiatric/Behavioral: Negative for confusion and dysphoric mood.       Objective:   Physical Exam  Constitutional: He is oriented to person, place, and time. He appears well-developed and well-nourished. No distress.  HENT:  Head: Normocephalic and atraumatic.  Right Ear: External ear normal.  Left Ear: External ear normal.  Mouth/Throat: Oropharynx is clear and moist.  Eyes: Conjunctivae and EOM are normal. Pupils are equal, round, and reactive to light.  Neck: Normal range of motion. Neck supple. No thyromegaly present.  Cardiovascular: Normal rate, regular rhythm and normal heart sounds.   No murmur heard. Pulmonary/Chest: No respiratory distress. He has no wheezes. He has no rales.  Abdominal: Soft. Bowel sounds are normal. He exhibits no distension and no mass. There is no tenderness. There is no rebound and no guarding.  Musculoskeletal: He exhibits no edema.  Lymphadenopathy:    He has no cervical adenopathy.  Neurological: He is alert and oriented to person, place, and time. He displays normal reflexes. No cranial nerve deficit.  Skin: No rash noted.  Psychiatric: He has a normal mood and affect.       Assessment:     Physical exam. We addressed several issues today including dyslipidemia, prediabetes with glucose 108, increasing weight gain, elevated blood pressure    Plan:     -Strongly advise weight loss -Establish more consistent exercise -Tetanus booster given -Handout on prediabetes given. Try to reduce sugar and starch intake -Low saturated fat diet -Follow-up in 2 months and if blood pressure not improving at that point with weight loss initiate blood pressure medication-probably ARB or ACE  [name] MD [facility]  -

## 2016-12-29 NOTE — Patient Instructions (Signed)
Fat and Cholesterol Restricted Diet High levels of fat and cholesterol in your blood may lead to various health problems, such as diseases of the heart, blood vessels, gallbladder, liver, and pancreas. Fats are concentrated sources of energy that come in various forms. Certain types of fat, including saturated fat, may be harmful in excess. Cholesterol is a substance needed by your body in small amounts. Your body makes all the cholesterol it needs. Excess cholesterol comes from the food you eat. When you have high levels of cholesterol and saturated fat in your blood, health problems can develop because the excess fat and cholesterol will gather along the walls of your blood vessels, causing them to narrow. Choosing the right foods will help you control your intake of fat and cholesterol. This will help keep the levels of these substances in your blood within normal limits and reduce your risk of disease. What is my plan? Your health care provider recommends that you:  Limit your fat intake to ______% or less of your total calories per day.  Limit the amount of cholesterol in your diet to less than _________mg per day.  Eat 20-30 grams of fiber each day. What types of fat should I choose?  Choose healthy fats more often. Choose monounsaturated and polyunsaturated fats, such as olive and canola oil, flaxseeds, walnuts, almonds, and seeds.  Eat more omega-3 fats. Good choices include salmon, mackerel, sardines, tuna, flaxseed oil, and ground flaxseeds. Aim to eat fish at least two times a week.  Limit saturated fats. Saturated fats are primarily found in animal products, such as meats, butter, and cream. Plant sources of saturated fats include palm oil, palm kernel oil, and coconut oil.  Avoid foods with partially hydrogenated oils in them. These contain trans fats. Examples of foods that contain trans fats are stick margarine, some tub margarines, cookies, crackers, and other baked goods. What  general guidelines do I need to follow? These guidelines for healthy eating will help you control your intake of fat and cholesterol:  Check food labels carefully to identify foods with trans fats or high amounts of saturated fat.  Fill one half of your plate with vegetables and green salads.  Fill one fourth of your plate with whole grains. Look for the word "whole" as the first word in the ingredient list.  Fill one fourth of your plate with lean protein foods.  Limit fruit to two servings a day. Choose fruit instead of juice.  Eat more foods that contain fiber, such as apples, broccoli, carrots, beans, peas, and barley.  Eat more home-cooked food and less restaurant, buffet, and fast food.  Limit or avoid alcohol.  Limit foods high in starch and sugar.  Limit fried foods.  Cook foods using methods other than frying. Baking, boiling, grilling, and broiling are all great options.  Lose weight if you are overweight. Losing just 5-10% of your initial body weight can help your overall health and prevent diseases such as diabetes and heart disease. What foods can I eat? Grains  Whole grains, such as whole wheat or whole grain breads, crackers, cereals, and pasta. Unsweetened oatmeal, bulgur, barley, quinoa, or brown rice. Corn or whole wheat flour tortillas. Vegetables  Fresh or frozen vegetables (raw, steamed, roasted, or grilled). Green salads. Fruits  All fresh, canned (in natural juice), or frozen fruits. Meats and other protein foods  Ground beef (85% or leaner), grass-fed beef, or beef trimmed of fat. Skinless chicken or [place]. Ground chicken or [place]. Pork trimmed  of fat. All fish and seafood. Eggs. Dried beans, peas, or lentils. Unsalted nuts or seeds. Unsalted canned or dry beans. Dairy  Low-fat dairy products, such as skim or 1% milk, 2% or reduced-fat cheeses, low-fat ricotta or cottage cheese, or plain low-fat yo Fats and oils  Tub margarines without trans fats.  Light or reduced-fat mayonnaise and salad dressings. Avocado. Olive, canola, sesame, or safflower oils. Natural peanut or almond butter (choose ones without added sugar and oil). The items listed above may not be a complete list of recommended foods or beverages. Contact your dietitian for more options.  Foods to avoid Grains  White bread. White pasta. White rice. Cornbread. Bagels, pastries, and croissants. Crackers that contain trans fat. Vegetables  White potatoes. Corn. Creamed or fried vegetables. Vegetables in a cheese sauce. Fruits  Dried fruits. Canned fruit in light or heavy syrup. Fruit juice. Meats and other protein foods  Fatty cuts of meat. Ribs, chicken wings, bacon, sausage, bologna, salami, chitterlings, fatback, hot dogs, bratwurst, and packaged luncheon meats. Liver and organ meats. Dairy  Whole or 2% milk, cream, half-and-half, and cream cheese. Whole milk cheeses. Whole-fat or sweetened yogurt. Full-fat cheeses. Nondairy creamers and whipped toppings. Processed cheese, cheese spreads, or cheese curds. Beverages  Alcohol. Sweetened drinks (such as sodas, lemonade, and fruit drinks or punches). Fats and oils  Butter, stick margarine, lard, shortening, ghee, or bacon fat. Coconut, palm kernel, or palm oils. Sweets and desserts  Corn syrup, sugars, honey, and molasses. Candy. Jam and jelly. Syrup. Sweetened cereals. Cookies, pies, cakes, donuts, muffins, and ice cream. The items listed above may not be a complete list of foods and beverages to avoid. Contact your dietitian for more information.  This information is not intended to replace advice given to you by your health care provider. Make sure you discuss any questions you have with your health care provider. Document Released: 11/20/2005 Document Revised: 12/11/2014 Document Reviewed: 02/18/2014 Elsevier Interactive Patient Education  2017 [employer] Prediabetes Prediabetes is the condition of having a blood  sugar (blood glucose) level that is higher than it should be, but not high enough for you to be diagnosed with type 2 diabetes. Having prediabetes puts you at risk for developing type 2 diabetes (type 2 diabetes mellitus). Prediabetes may be called impaired glucose tolerance or impaired fasting glucose. Prediabetes usually does not cause symptoms. Your health care provider can diagnose this condition with blood tests. You may be tested for prediabetes if you are overweight and if you have at least one other risk factor for prediabetes. Risk factors for prediabetes include:  Having a family member with type 2 diabetes.  Being overweight or obese.  Being older than age 47.  Being of American-Indian, African-American, Hispanic/Latino, or Asian/Pacific Islander descent.  Having an inactive (sedentary) lifestyle.  Having a history of gestational diabetes or polycystic ovarian syndrome (PCOS).  Having low levels of good cholesterol (HDL-C) or high levels of blood fats (triglycerides).  Having high blood pressure. What is blood glucose and how is blood glucose measured?   Blood glucose refers to the amount of glucose in your bloodstream. Glucose comes from eating foods that contain sugars and starches (carbohydrates) that the body breaks down into glucose. Your blood glucose level may be measured in mg/dL (milligrams per deciliter) or mmol/L (millimoles per liter).Your blood glucose may be checked with one or more of the following blood tests:  A fasting blood glucose (FBG) test. You will not be allowed to eat (you will  fast) for at least 8 hours before a blood sample is taken.  A normal range for FBG is 70-100 mg/dl (2.9-5.6 mmol/L).  An A1c (hemoglobin A1c) blood test. This test provides information about blood glucose control over the previous 2?3months.  An oral glucose tolerance test (OGTT). This test measures your blood glucose twice:  After fasting. This is your baseline  level.  Two hours after you drink a beverage that contains glucose. You may be diagnosed with prediabetes:  If your FBG is 100?125 mg/dL (2.1-3.0 mmol/L).  If your A1c level is 5.7?6.4%.  If your OGGT result is 140?199 mg/dL (8.6-57 mmol/L). These blood tests may be repeated to confirm your diagnosis. What happens if blood glucose is too high? The pancreas produces a hormone (insulin) that helps move glucose from the bloodstream into cells. When cells in the body do not respond properly to insulin that the body makes (insulin resistance), excess glucose builds up in the blood instead of going into cells. As a result, high blood glucose (hyperglycemia) can develop, which can cause many complications. This is a symptom of prediabetes. What can happen if blood glucose stays higher than normal for a long time? Having high blood glucose for a long time is dangerous. Too much glucose in your blood can damage your nerves and blood vessels. Long-term damage can lead to complications from diabetes, which may include:  Heart disease.  Stroke.  Blindness.  Kidney disease.  Depression.  Poor circulation in the feet and legs, which could lead to surgical removal (amputation) in severe cases. How can prediabetes be prevented from turning into type 2 diabetes?   To help prevent type 2 diabetes, take the following actions:  Be physically active.  Do moderate-intensity physical activity for at least 30 minutes on at least 5 days of the week, or as much as told by your health care provider. This could be brisk walking, biking, or water aerobics.  Ask your health care provider what activities are safe for you. A mix of physical activities may be best, such as walking, swimming, cycling, and strength training.  Lose weight as told by your health care provider.  Losing 5-7% of your body weight can reverse insulin resistance.  Your health care provider can determine how much weight loss is best  for you and can help you lose weight safely.  Follow a healthy meal plan. This includes eating lean proteins, complex carbohydrates, fresh fruits and vegetables, low-fat dairy products, and healthy fats.  Follow instructions from your health care provider about eating or drinking restrictions.  Make an appointment to see a diet and nutrition specialist (registered dietitian) to help you create a healthy eating plan that is right for you.  Do not smoke or use any tobacco products, such as cigarettes, chewing tobacco, and e-cigarettes. If you need help quitting, ask your health care provider.  Take over-the-counter and prescription medicines as told by your health care provider. You may be prescribed medicines that help lower the risk of type 2 diabetes. This information is not intended to replace advice given to you by your health care provider. Make sure you discuss any questions you have with your health care provider. Document Released: 03/13/2016 Document Revised: 04/27/2016 Document Reviewed: 01/11/2016 Elsevier Interactive Patient Education  2017 [employer].

## 2017-03-05 ENCOUNTER — Encounter: Payer: Self-pay | Admitting: Family Medicine

## 2017-03-05 ENCOUNTER — Ambulatory Visit (INDEPENDENT_AMBULATORY_CARE_PROVIDER_SITE_OTHER): Payer: BLUE CROSS/BLUE SHIELD | Admitting: Family Medicine

## 2017-03-05 VITALS — BP 140/82 | HR 78 | Temp 98.6°F | Wt 263.4 lb

## 2017-03-05 DIAGNOSIS — E669 Obesity, unspecified: Secondary | ICD-10-CM | POA: Insufficient documentation

## 2017-03-05 DIAGNOSIS — I10 Essential (primary) hypertension: Secondary | ICD-10-CM | POA: Diagnosis not present

## 2017-03-05 NOTE — Progress Notes (Signed)
Subjective:     Patient ID: Benjamin Odom, male   DOB: 08-25-1971, 46 y.o.   MRN: [id number]  HPI   Patient here for follow-up elevated blood pressure and also prediabetes by recent labs with glucose 108. He has done a tremendous job with diet modification and has lost 14 pounds since last visit. Feels better overall. He's done this mostly through reducing high-fat foods and sugars and starches.  Exercise has not changed much yet. He acquired a automated cuff at home and has gotten fairly consistent readings below 140 systolic and below 90 diastolic. No headaches or chest pains. No dizziness.  Past Medical History:  Diagnosis Date  . ELEVATED BLOOD PRESSURE 07/19/2009  . GANGLION CYST 07/19/2009  . GERD 07/19/2009  . Prepatellar bursitis 07/19/2009   Past Surgical History:  Procedure Laterality Date  . THUMB ARTHROSCOPY  1993   re-attachment Rt thumb    reports that he quit smoking about 19 years ago. His smoking use included Cigarettes. He has a 3.00 pack-year smoking history. He has never used smokeless tobacco. He reports that he does not drink alcohol or use drugs. family history includes Arthritis in his other; Cancer in his other; Diabetes in his brother, maternal grandfather, mother, and other; Hypertension in his mother and other. No Known Allergies   Review of Systems  Constitutional: Negative for fatigue.  Eyes: Negative for visual disturbance.  Respiratory: Negative for cough, chest tightness and shortness of breath.   Cardiovascular: Negative for chest pain, palpitations and leg swelling.  Neurological: Negative for dizziness, syncope, weakness, light-headedness and headaches.       Objective:   Physical Exam  Constitutional: He is oriented to person, place, and time. He appears well-developed and well-nourished.  HENT:  Right Ear: External ear normal.  Left Ear: External ear normal.  Mouth/Throat: Oropharynx is clear and moist.  Eyes: Pupils are equal, round, and  reactive to light.  Neck: Neck supple. No thyromegaly present.  Cardiovascular: Normal rate and regular rhythm.   Pulmonary/Chest: Effort normal and breath sounds normal. No respiratory distress. He has no wheezes. He has no rales.  Musculoskeletal: He exhibits no edema.  Neurological: He is alert and oriented to person, place, and time.       Assessment:     Hypertension improved with recent weight loss. He also has prediabetes by recent labs and suspect this will improve and basically reverse with his weight also efforts    Plan:     -Follow-up in 4 months and repeat fasting glucose then -Continue to monitor home blood pressures closely -Continue weight loss efforts and try establish more consistent exercise  [name] MD [facility] at [facility]

## 2017-03-05 NOTE — Progress Notes (Signed)
Pre visit review using our clinic review tool, if applicable. No additional management support is needed unless otherwise documented below in the visit note. 

## 2017-04-26 DIAGNOSIS — H5213 Myopia, bilateral: Secondary | ICD-10-CM | POA: Diagnosis not present

## 2017-07-09 ENCOUNTER — Encounter: Payer: Self-pay | Admitting: Family Medicine

## 2017-07-09 ENCOUNTER — Ambulatory Visit (INDEPENDENT_AMBULATORY_CARE_PROVIDER_SITE_OTHER): Payer: BLUE CROSS/BLUE SHIELD | Admitting: Family Medicine

## 2017-07-09 VITALS — BP 142/80 | HR 110 | Temp 99.4°F | Wt 271.3 lb

## 2017-07-09 DIAGNOSIS — R7303 Prediabetes: Secondary | ICD-10-CM | POA: Insufficient documentation

## 2017-07-09 DIAGNOSIS — I10 Essential (primary) hypertension: Secondary | ICD-10-CM

## 2017-07-09 DIAGNOSIS — R739 Hyperglycemia, unspecified: Secondary | ICD-10-CM | POA: Diagnosis not present

## 2017-07-09 LAB — POCT GLYCOSYLATED HEMOGLOBIN (HGB A1C): Hemoglobin A1C: 5.9

## 2017-07-09 NOTE — Progress Notes (Signed)
Subjective:     Patient ID: Benjamin Odom, male   DOB: 08/12/1971, 46 y.o.   MRN: [id number]  HPI Patient seen for follow-up regarding borderline elevated blood pressure and history of prediabetes. He has not been very compliant with diet and is still drinking a lot of sodas and eating a lot of high sugar snacks. His weight is up 8 pounds from last visit. He thinks some of this is due to increased muscle mass from weight lifting but overall very poor compliance. Very strong family history of type 2 diabetes in both parents. Patient denies any polyuria or polydipsia.  Has taken HCTZ and past for blood pressure but currently not taking any medications.  Past Medical History:  Diagnosis Date  . ELEVATED BLOOD PRESSURE 07/19/2009  . GANGLION CYST 07/19/2009  . GERD 07/19/2009  . Prepatellar bursitis 07/19/2009   Past Surgical History:  Procedure Laterality Date  . THUMB ARTHROSCOPY  1993   re-attachment Rt thumb    reports that he quit smoking about 19 years ago. His smoking use included Cigarettes. He has a 3.00 pack-year smoking history. He has never used smokeless tobacco. He reports that he does not drink alcohol or use drugs. family history includes Arthritis in his other; Cancer in his other; Diabetes in his brother, maternal grandfather, mother, and other; Hypertension in his mother and other. No Known Allergies   Review of Systems  Constitutional: Negative for fatigue.  Eyes: Negative for visual disturbance.  Respiratory: Negative for cough, chest tightness and shortness of breath.   Cardiovascular: Negative for chest pain, palpitations and leg swelling.  Endocrine: Negative for polydipsia and polyuria.  Neurological: Negative for dizziness, syncope, weakness, light-headedness and headaches.       Objective:   Physical Exam  Constitutional: He appears well-developed and well-nourished.  Cardiovascular: Normal rate and regular rhythm.   Pulmonary/Chest: Effort normal and  breath sounds normal. No respiratory distress. He has no wheezes. He has no rales.  Musculoskeletal: He exhibits no edema.       Assessment:     #1 borderline hypertension  #2 prediabetes.  A1c today 5.9%    Plan:     -Discussed lifestyle management. We discussed possible initiation of blood pressure medication but he prefers 3 months of weight loss and reduction in sugars and starches and salt and reassess -If blood pressure not consistently less than 130/80 at follow-up in 3 months initiate blood pressure medication -We discussed specific recommendations to help him lose weight and needs to reduce sodas and several snacks foods especially  Kristian CoveyBruce W Tashaya Ancrum MD [facility]

## 2017-08-23 ENCOUNTER — Encounter: Payer: Self-pay | Admitting: Family Medicine

## 2017-10-08 ENCOUNTER — Ambulatory Visit: Payer: BLUE CROSS/BLUE SHIELD | Admitting: Family Medicine

## 2017-12-10 ENCOUNTER — Ambulatory Visit: Payer: BLUE CROSS/BLUE SHIELD | Admitting: Family Medicine

## 2017-12-10 ENCOUNTER — Encounter: Payer: Self-pay | Admitting: Family Medicine

## 2017-12-10 VITALS — BP 150/60 | HR 75 | Temp 98.4°F | Ht 69.0 in | Wt 278.5 lb

## 2017-12-10 DIAGNOSIS — I10 Essential (primary) hypertension: Secondary | ICD-10-CM

## 2017-12-10 DIAGNOSIS — R7303 Prediabetes: Secondary | ICD-10-CM

## 2017-12-10 DIAGNOSIS — Z23 Encounter for immunization: Secondary | ICD-10-CM | POA: Diagnosis not present

## 2017-12-10 LAB — POCT GLYCOSYLATED HEMOGLOBIN (HGB A1C): Hemoglobin A1C: 6

## 2017-12-10 NOTE — Progress Notes (Signed)
Subjective:     Patient ID: Benjamin Odom, male   DOB: 03/19/1971, 47 y.o.   MRN: [id number]  HPI Patient seen for follow-up regarding elevated blood pressure and prediabetes. Was seen back in August with A1c 5.9%. Blood pressure then 142/80. He has not been exercising any and unfortunately has gained about 7 pounds over the holidays. Poor compliance with diet. No polyuria or polydipsia. Positive family history of type 2 diabetes mother and brother.  Past Medical History:  Diagnosis Date  . ELEVATED BLOOD PRESSURE 07/19/2009  . GANGLION CYST 07/19/2009  . GERD 07/19/2009  . Prepatellar bursitis 07/19/2009   Past Surgical History:  Procedure Laterality Date  . THUMB ARTHROSCOPY  1993   re-attachment Rt thumb    reports that he quit smoking about 19 years ago. His smoking use included cigarettes. He has a 3.00 pack-year smoking history. he has never used smokeless tobacco. He reports that he does not drink alcohol or use drugs. family history includes Arthritis in his other; Cancer in his other; Diabetes in his brother, maternal grandfather, mother, and other; Hypertension in his mother and other. No Known Allergies   Review of Systems  Constitutional: Negative for fatigue.  Eyes: Negative for visual disturbance.  Respiratory: Negative for cough, chest tightness and shortness of breath.   Cardiovascular: Negative for chest pain, palpitations and leg swelling.  Gastrointestinal: Negative for abdominal pain.  Endocrine: Negative for polydipsia and polyuria.  Genitourinary: Negative for dysuria.  Neurological: Negative for dizziness, syncope, weakness, light-headedness and headaches.       Objective:   Physical Exam  Constitutional: He is oriented to person, place, and time. He appears well-developed and well-nourished.  HENT:  Right Ear: External ear normal.  Left Ear: External ear normal.  Mouth/Throat: Oropharynx is clear and moist.  Eyes: Pupils are equal, round, and reactive  to light.  Neck: Neck supple. No thyromegaly present.  Cardiovascular: Normal rate and regular rhythm.  Pulmonary/Chest: Effort normal and breath sounds normal. No respiratory distress. He has no wheezes. He has no rales.  Musculoskeletal: He exhibits no edema.  Neurological: He is alert and oriented to person, place, and time.       Assessment:     #1 history of prediabetes  #2 elevated blood pressure. Right arm large cuff 130/68 and left arm 142/80    Plan:     -Repeat hemoglobin A1c=6.0% -Strongly advised to establish more consistent aerobic exercise -Needs to lose some weight with overall calorie restriction. -we discussed whether to initiate BP medication.  Pt asks that we give this another 3 months and then re-assess -flu vaccine given.Benjamin Odom.  Benjamin Earnhart W Trannie Bardales MD [facility]

## 2017-12-10 NOTE — Patient Instructions (Signed)
DASH Eating Plan DASH stands for "Dietary Approaches to Stop Hypertension." The DASH eating plan is a healthy eating plan that has been shown to reduce high blood pressure (hypertension). It may also reduce your risk for type 2 diabetes, heart disease, and stroke. The DASH eating plan may also help with weight loss. What are tips for following this plan? General guidelines  Avoid eating more than 2,300 mg (milligrams) of salt (sodium) a day. If you have hypertension, you may need to reduce your sodium intake to 1,500 mg a day.  Limit alcohol intake to no more than 1 drink a day for nonpregnant women and 2 drinks a day for men. One drink equals 12 oz of beer, 5 oz of wine, or 1 oz of hard liquor.  Work with your health care provider to maintain a healthy body weight or to lose weight. Ask what an ideal weight is for you.  Get at least 30 minutes of exercise that causes your heart to beat faster (aerobic exercise) most days of the week. Activities may include walking, swimming, or biking.  Work with your health care provider or diet and nutrition specialist (dietitian) to adjust your eating plan to your individual calorie needs. Reading food labels  Check food labels for the amount of sodium per serving. Choose foods with less than 5 percent of the Daily Value of sodium. Generally, foods with less than 300 mg of sodium per serving fit into this eating plan.  To find whole grains, look for the word "whole" as the first word in the ingredient list. Shopping  Buy products labeled as "low-sodium" or "no salt added."  Buy fresh foods. Avoid canned foods and premade or frozen meals. Cooking  Avoid adding salt when cooking. Use salt-free seasonings or herbs instead of table salt or sea salt. Check with your health care provider or pharmacist before using salt substitutes.  Do not fry foods. Cook foods using healthy methods such as baking, boiling, grilling, and broiling instead.  Cook with  heart-healthy oils, such as olive, canola, soybean, or sunflower oil. Meal planning   Eat a balanced diet that includes: ? 5 or more servings of fruits and vegetables each day. At each meal, try to fill half of your plate with fruits and vegetables. ? Up to 6-8 servings of whole grains each day. ? Less than 6 oz of lean meat, poultry, or fish each day. A 3-oz serving of meat is about the same size as a deck of cards. One egg equals 1 oz. ? 2 servings of low-fat dairy each day. ? A serving of nuts, seeds, or beans 5 times each week. ? Heart-healthy fats. Healthy fats called Omega-3 fatty acids are found in foods such as flaxseeds and coldwater fish, like sardines, salmon, and mackerel.  Limit how much you eat of the following: ? Canned or prepackaged foods. ? Food that is high in trans fat, such as fried foods. ? Food that is high in saturated fat, such as fatty meat. ? Sweets, desserts, sugary drinks, and other foods with added sugar. ? Full-fat dairy products.  Do not salt foods before eating.  Try to eat at least 2 vegetarian meals each week.  Eat more home-cooked food and less restaurant, buffet, and fast food.  When eating at a restaurant, ask that your food be prepared with less salt or no salt, if possible. What foods are recommended? The items listed may not be a complete list. Talk with your dietitian about what   dietary choices are best for you. Grains Whole-grain or whole-wheat bread. Whole-grain or whole-wheat pasta. Brown rice. Oatmeal. Quinoa. Bulgur. Whole-grain and low-sodium cereals. Pita bread. Low-fat, low-sodium crackers. Whole-wheat flour tortillas. Vegetables Fresh or frozen vegetables (raw, steamed, roasted, or grilled). Low-sodium or reduced-sodium tomato and vegetable juice. Low-sodium or reduced-sodium tomato sauce and tomato paste. Low-sodium or reduced-sodium canned vegetables. Fruits All fresh, dried, or frozen fruit. Canned fruit in natural juice (without  added sugar). Meat and other protein foods Skinless chicken or turkey. Ground chicken or turkey. Pork with fat trimmed off. Fish and seafood. Egg whites. Dried beans, peas, or lentils. Unsalted nuts, nut butters, and seeds. Unsalted canned beans. Lean cuts of beef with fat trimmed off. Low-sodium, lean deli meat. Dairy Low-fat (1%) or fat-free (skim) milk. Fat-free, low-fat, or reduced-fat cheeses. Nonfat, low-sodium ricotta or cottage cheese. Low-fat or nonfat yogurt. Low-fat, low-sodium cheese. Fats and oils Soft margarine without trans fats. Vegetable oil. Low-fat, reduced-fat, or light mayonnaise and salad dressings (reduced-sodium). Canola, safflower, olive, soybean, and sunflower oils. Avocado. Seasoning and other foods Herbs. Spices. Seasoning mixes without salt. Unsalted popcorn and pretzels. Fat-free sweets. What foods are not recommended? The items listed may not be a complete list. Talk with your dietitian about what dietary choices are best for you. Grains Baked goods made with fat, such as croissants, muffins, or some breads. Dry pasta or rice meal packs. Vegetables Creamed or fried vegetables. Vegetables in a cheese sauce. Regular canned vegetables (not low-sodium or reduced-sodium). Regular canned tomato sauce and paste (not low-sodium or reduced-sodium). Regular tomato and vegetable juice (not low-sodium or reduced-sodium). Pickles. Olives. Fruits Canned fruit in a light or heavy syrup. Fried fruit. Fruit in cream or butter sauce. Meat and other protein foods Fatty cuts of meat. Ribs. Fried meat. Bacon. Sausage. Bologna and other processed lunch meats. Salami. Fatback. Hotdogs. Bratwurst. Salted nuts and seeds. Canned beans with added salt. Canned or smoked fish. Whole eggs or egg yolks. Chicken or turkey with skin. Dairy Whole or 2% milk, cream, and half-and-half. Whole or full-fat cream cheese. Whole-fat or sweetened yogurt. Full-fat cheese. Nondairy creamers. Whipped toppings.  Processed cheese and cheese spreads. Fats and oils Butter. Stick margarine. Lard. Shortening. Ghee. Bacon fat. Tropical oils, such as coconut, palm kernel, or palm oil. Seasoning and other foods Salted popcorn and pretzels. Onion salt, garlic salt, seasoned salt, table salt, and sea salt. Worcestershire sauce. Tartar sauce. Barbecue sauce. Teriyaki sauce. Soy sauce, including reduced-sodium. Steak sauce. Canned and packaged gravies. Fish sauce. Oyster sauce. Cocktail sauce. Horseradish that you find on the shelf. Ketchup. Mustard. Meat flavorings and tenderizers. Bouillon cubes. Hot sauce and Tabasco sauce. Premade or packaged marinades. Premade or packaged taco seasonings. Relishes. Regular salad dressings. Where to find more information:  National Heart, Lung, and Blood Institute: www.nhlbi.nih.gov  American Heart Association: www.heart.org Summary  The DASH eating plan is a healthy eating plan that has been shown to reduce high blood pressure (hypertension). It may also reduce your risk for type 2 diabetes, heart disease, and stroke.  With the DASH eating plan, you should limit salt (sodium) intake to 2,300 mg a day. If you have hypertension, you may need to reduce your sodium intake to 1,500 mg a day.  When on the DASH eating plan, aim to eat more fresh fruits and vegetables, whole grains, lean proteins, low-fat dairy, and heart-healthy fats.  Work with your health care provider or diet and nutrition specialist (dietitian) to adjust your eating plan to your individual   calorie needs. This information is not intended to replace advice given to you by your health care provider. Make sure you discuss any questions you have with your health care provider. Document Released: 11/09/2011 Document Revised: 11/13/2016 Document Reviewed: 11/13/2016 Elsevier Interactive Patient Education  2018 Elsevier Inc.  Hypertension Hypertension, commonly called high blood pressure, is when the force of blood  pumping through the arteries is too strong. The arteries are the blood vessels that carry blood from the heart throughout the body. Hypertension forces the heart to work harder to pump blood and may cause arteries to become narrow or stiff. Having untreated or uncontrolled hypertension can cause heart attacks, strokes, kidney disease, and other problems. A blood pressure reading consists of a higher number over a lower number. Ideally, your blood pressure should be below 120/80. The first ("top") number is called the systolic pressure. It is a measure of the pressure in your arteries as your heart beats. The second ("bottom") number is called the diastolic pressure. It is a measure of the pressure in your arteries as the heart relaxes. What are the causes? The cause of this condition is not known. What increases the risk? Some risk factors for high blood pressure are under your control. Others are not. Factors you can change  Smoking.  Having type 2 diabetes mellitus, high cholesterol, or both.  Not getting enough exercise or physical activity.  Being overweight.  Having too much fat, sugar, calories, or salt (sodium) in your diet.  Drinking too much alcohol. Factors that are difficult or impossible to change  Having chronic kidney disease.  Having a family history of high blood pressure.  Age. Risk increases with age.  Race. You may be at higher risk if you are African-American.  Gender. Men are at higher risk than women before age 45. After age 65, women are at higher risk than men.  Having obstructive sleep apnea.  Stress. What are the signs or symptoms? Extremely high blood pressure (hypertensive crisis) may cause:  Headache.  Anxiety.  Shortness of breath.  Nosebleed.  Nausea and vomiting.  Severe chest pain.  Jerky movements you cannot control (seizures).  How is this diagnosed? This condition is diagnosed by measuring your blood pressure while you are  seated, with your arm resting on a surface. The cuff of the blood pressure monitor will be placed directly against the skin of your upper arm at the level of your heart. It should be measured at least twice using the same arm. Certain conditions can cause a difference in blood pressure between your right and left arms. Certain factors can cause blood pressure readings to be lower or higher than normal (elevated) for a short period of time:  When your blood pressure is higher when you are in a health care provider's office than when you are at home, this is called white coat hypertension. Most people with this condition do not need medicines.  When your blood pressure is higher at home than when you are in a health care provider's office, this is called masked hypertension. Most people with this condition may need medicines to control blood pressure.  If you have a high blood pressure reading during one visit or you have normal blood pressure with other risk factors:  You may be asked to return on a different day to have your blood pressure checked again.  You may be asked to monitor your blood pressure at home for 1 week or longer.  If you are   diagnosed with hypertension, you may have other blood or imaging tests to help your health care provider understand your overall risk for other conditions. How is this treated? This condition is treated by making healthy lifestyle changes, such as eating healthy foods, exercising more, and reducing your alcohol intake. Your health care provider may prescribe medicine if lifestyle changes are not enough to get your blood pressure under control, and if:  Your systolic blood pressure is above 130.  Your diastolic blood pressure is above 80.  Your personal target blood pressure may vary depending on your medical conditions, your age, and other factors. Follow these instructions at home: Eating and drinking  Eat a diet that is high in fiber and potassium,  and low in sodium, added sugar, and fat. An example eating plan is called the DASH (Dietary Approaches to Stop Hypertension) diet. To eat this way: ? Eat plenty of fresh fruits and vegetables. Try to fill half of your plate at each meal with fruits and vegetables. ? Eat whole grains, such as whole wheat pasta, brown rice, or whole grain bread. Fill about one quarter of your plate with whole grains. ? Eat or drink low-fat dairy products, such as skim milk or low-fat yogurt. ? Avoid fatty cuts of meat, processed or cured meats, and poultry with skin. Fill about one quarter of your plate with lean proteins, such as fish, chicken without skin, beans, eggs, and tofu. ? Avoid premade and processed foods. These tend to be higher in sodium, added sugar, and fat.  Reduce your daily sodium intake. Most people with hypertension should eat less than 1,500 mg of sodium a day.  Limit alcohol intake to no more than 1 drink a day for nonpregnant women and 2 drinks a day for men. One drink equals 12 oz of beer, 5 oz of wine, or 1 oz of hard liquor. Lifestyle  Work with your health care provider to maintain a healthy body weight or to lose weight. Ask what an ideal weight is for you.  Get at least 30 minutes of exercise that causes your heart to beat faster (aerobic exercise) most days of the week. Activities may include walking, swimming, or biking.  Include exercise to strengthen your muscles (resistance exercise), such as pilates or lifting weights, as part of your weekly exercise routine. Try to do these types of exercises for 30 minutes at least 3 days a week.  Do not use any products that contain nicotine or tobacco, such as cigarettes and e-cigarettes. If you need help quitting, ask your health care provider.  Monitor your blood pressure at home as told by your health care provider.  Keep all follow-up visits as told by your health care provider. This is important. Medicines  Take over-the-counter and  prescription medicines only as told by your health care provider. Follow directions carefully. Blood pressure medicines must be taken as prescribed.  Do not skip doses of blood pressure medicine. Doing this puts you at risk for problems and can make the medicine less effective.  Ask your health care provider about side effects or reactions to medicines that you should watch for. Contact a health care provider if:  You think you are having a reaction to a medicine you are taking.  You have headaches that keep coming back (recurring).  You feel dizzy.  You have swelling in your ankles.  You have trouble with your vision. Get help right away if:  You develop a severe headache or confusion.    You have unusual weakness or numbness.  You feel faint.  You have severe pain in your chest or abdomen.  You vomit repeatedly.  You have trouble breathing. Summary  Hypertension is when the force of blood pumping through your arteries is too strong. If this condition is not controlled, it may put you at risk for serious complications.  Your personal target blood pressure may vary depending on your medical conditions, your age, and other factors. For most people, a normal blood pressure is less than 120/80.  Hypertension is treated with lifestyle changes, medicines, or a combination of both. Lifestyle changes include weight loss, eating a healthy, low-sodium diet, exercising more, and limiting alcohol. This information is not intended to replace advice given to you by your health care provider. Make sure you discuss any questions you have with your health care provider. Document Released: 11/20/2005 Document Revised: 10/18/2016 Document Reviewed: 10/18/2016 Elsevier Interactive Patient Education  2018 Elsevier Inc.  

## 2018-03-11 ENCOUNTER — Ambulatory Visit: Payer: BLUE CROSS/BLUE SHIELD | Admitting: Family Medicine

## 2018-04-08 ENCOUNTER — Encounter: Payer: Self-pay | Admitting: Family Medicine

## 2018-04-08 ENCOUNTER — Ambulatory Visit (INDEPENDENT_AMBULATORY_CARE_PROVIDER_SITE_OTHER): Payer: BLUE CROSS/BLUE SHIELD | Admitting: Family Medicine

## 2018-04-08 VITALS — BP 140/100 | HR 90 | Temp 98.7°F | Wt 278.5 lb

## 2018-04-08 DIAGNOSIS — I10 Essential (primary) hypertension: Secondary | ICD-10-CM

## 2018-04-08 DIAGNOSIS — R7303 Prediabetes: Secondary | ICD-10-CM

## 2018-04-08 LAB — POCT GLYCOSYLATED HEMOGLOBIN (HGB A1C): Hemoglobin A1C: 5.9

## 2018-04-08 MED ORDER — AMLODIPINE BESYLATE 5 MG PO TABS: 5.0000 mg | ORAL_TABLET | Freq: Every day | ORAL | 5 refills | Status: DC

## 2018-04-08 NOTE — Patient Instructions (Signed)

## 2018-04-08 NOTE — Progress Notes (Signed)
  Subjective:     Patient ID: Benjamin Odom, male   DOB: Apr 19, 1971, 47 y.o.   MRN: [id number]  HPI Patient seen for follow-up regarding elevated blood pressure and prediabetes. Last A1c 6.0%. His weight is unchanged. He lost some weight but then went on a cruise and gained the weight back. No consistent exercise  History of elevated blood pressure and at one point took HCTZ. Last blood pressure here was 150/60. We've recommended 3-4 month trial of weight loss and exercise and then follow-up. No recent headaches. No chest pains.  Past Medical History:  Diagnosis Date  . ELEVATED BLOOD PRESSURE 07/19/2009  . GANGLION CYST 07/19/2009  . GERD 07/19/2009  . Prepatellar bursitis 07/19/2009   Past Surgical History:  Procedure Laterality Date  . THUMB ARTHROSCOPY  1993   re-attachment Rt thumb    reports that he quit smoking about 20 years ago. His smoking use included cigarettes. He has a 3.00 pack-year smoking history. He has never used smokeless tobacco. He reports that he does not drink alcohol or use drugs. family history includes Arthritis in his other; Cancer in his other; Diabetes in his brother, maternal grandfather, mother, and other; Hypertension in his mother and other. No Known Allergies   Review of Systems  Constitutional: Negative for fatigue.  Eyes: Negative for visual disturbance.  Respiratory: Negative for cough, chest tightness and shortness of breath.   Cardiovascular: Negative for chest pain, palpitations and leg swelling.  Neurological: Negative for dizziness, syncope, weakness, light-headedness and headaches.       Objective:   Physical Exam  Constitutional: He is oriented to person, place, and time. He appears well-developed and well-nourished.  HENT:  Right Ear: External ear normal.  Left Ear: External ear normal.  Mouth/Throat: Oropharynx is clear and moist.  Eyes: Pupils are equal, round, and reactive to light.  Neck: Neck supple. No thyromegaly present.   Cardiovascular: Normal rate and regular rhythm.  Pulmonary/Chest: Effort normal and breath sounds normal. No respiratory distress. He has no wheezes. He has no rales.  Musculoskeletal: He exhibits no edema.  Neurological: He is alert and oriented to person, place, and time.       Assessment:     #1 prediabetes. Stable with A1c today 5.9%  #2 hypertension currently untreated. Repeat blood pressure left arm seated after rest large cuff 150/96    Plan:     -Start amlodipine 5 mg once daily -He is encouraged to lose some weight and establish more consistent exercise -Office follow-up in one month reassess blood pressure  [name] MD [facility]

## 2018-05-13 ENCOUNTER — Ambulatory Visit: Payer: BLUE CROSS/BLUE SHIELD | Admitting: Family Medicine

## 2018-06-10 ENCOUNTER — Encounter: Payer: Self-pay | Admitting: Family Medicine

## 2018-06-10 ENCOUNTER — Ambulatory Visit (INDEPENDENT_AMBULATORY_CARE_PROVIDER_SITE_OTHER): Payer: BLUE CROSS/BLUE SHIELD | Admitting: Family Medicine

## 2018-06-10 VITALS — BP 138/88 | HR 95 | Temp 98.4°F | Wt 275.3 lb

## 2018-06-10 DIAGNOSIS — I10 Essential (primary) hypertension: Secondary | ICD-10-CM | POA: Diagnosis not present

## 2018-06-10 NOTE — Progress Notes (Signed)
  Subjective:     Patient ID: Benjamin Odom, male   DOB: 03/01/1971, 47 y.o.   MRN: [id number]  HPI Patient seen for follow-up hypertension. We added amlodipine 5 mg daily last visit. He thinks he had some increased frequency of urination since then but no other side effects. No headaches. No peripheral edema. No alcohol use. Rare nonsteroidal use.  His weight is down 3 pounds since last visit. He still drinks some sodas and has not been very strict with diet. Trying to exercise more.  Past Medical History:  Diagnosis Date  . ELEVATED BLOOD PRESSURE 07/19/2009  . GANGLION CYST 07/19/2009  . GERD 07/19/2009  . Prepatellar bursitis 07/19/2009   Past Surgical History:  Procedure Laterality Date  . THUMB ARTHROSCOPY  1993   re-attachment Rt thumb    reports that he quit smoking about 20 years ago. His smoking use included cigarettes. He has a 3.00 pack-year smoking history. He has never used smokeless tobacco. He reports that he does not drink alcohol or use drugs. family history includes Arthritis in his other; Cancer in his other; Diabetes in his brother, maternal grandfather, mother, and other; Hypertension in his mother and other. No Known Allergies   Review of Systems  Constitutional: Negative for fatigue.  Eyes: Negative for visual disturbance.  Respiratory: Negative for cough, chest tightness and shortness of breath.   Cardiovascular: Negative for chest pain, palpitations and leg swelling.  Neurological: Negative for dizziness, syncope, weakness, light-headedness and headaches.       Objective:   Physical Exam  Constitutional: He is oriented to person, place, and time. He appears well-developed and well-nourished.  HENT:  Right Ear: External ear normal.  Left Ear: External ear normal.  Mouth/Throat: Oropharynx is clear and moist.  Eyes: Pupils are equal, round, and reactive to light.  Neck: Neck supple. No thyromegaly present.  Cardiovascular: Normal rate and regular  rhythm.  Pulmonary/Chest: Effort normal and breath sounds normal. No respiratory distress. He has no wheezes. He has no rales.  Musculoskeletal: He exhibits no edema.  Neurological: He is alert and oriented to person, place, and time.       Assessment:     Hypertension-improved on amlodipine    Plan:     -We've challenged him to try to lose some weight -Increase aerobic exercise -Continue amlodipine -Follow-up in 3 months  Kristian CoveyBruce W Cataldo Cosgriff MD [facility]

## 2018-06-10 NOTE — Patient Instructions (Signed)
Lose some weight  Reduce sodas  Continue with regular aerobic/cardio exercise.

## 2018-09-02 DIAGNOSIS — Z23 Encounter for immunization: Secondary | ICD-10-CM | POA: Diagnosis not present

## 2018-09-09 ENCOUNTER — Ambulatory Visit: Payer: BLUE CROSS/BLUE SHIELD | Admitting: Family Medicine

## 2018-10-02 ENCOUNTER — Ambulatory Visit: Payer: BLUE CROSS/BLUE SHIELD | Admitting: Family Medicine

## 2018-10-02 ENCOUNTER — Encounter: Payer: Self-pay | Admitting: Family Medicine

## 2018-10-02 ENCOUNTER — Other Ambulatory Visit: Payer: Self-pay

## 2018-10-02 VITALS — BP 150/96 | HR 109 | Temp 98.5°F | Ht 69.0 in | Wt 273.3 lb

## 2018-10-02 DIAGNOSIS — R202 Paresthesia of skin: Secondary | ICD-10-CM | POA: Diagnosis not present

## 2018-10-02 DIAGNOSIS — I10 Essential (primary) hypertension: Secondary | ICD-10-CM | POA: Diagnosis not present

## 2018-10-02 MED ORDER — AMLODIPINE BESYLATE 10 MG PO TABS: 10.0000 mg | ORAL_TABLET | Freq: Every day | ORAL | 11 refills | Status: DC

## 2018-10-02 NOTE — Patient Instructions (Signed)
Increase the Amlodipine to 10 mg once daily  Follow up for any progressive numbness or any new symptoms such as weakness.

## 2018-10-02 NOTE — Progress Notes (Signed)
  Subjective:     Patient ID: Benjamin Odom, male   DOB: 23-May-1971, 47 y.o.   MRN: [id number]  HPI Patient seen with left foot numbness.  He states he woke up Thursday morning with some numbness dorsum and ventral aspect left foot near the mid aspect.  He has had some gradual improvement since then.  He denied any upper extremity symptoms.  No radiculitis symptoms.  No low back pain.  No weakness.  No recent speech changes.  No confusion.  No visual changes.  He states he had done about an hour and 40 minutes on the elliptical the night before but denies any injury.  He has hypertension treated with amlodipine 5 mg daily.  Compliant with therapy.  Not monitoring blood pressures at home.  No recent headaches.  Trying to exercise more.  Has lost a few pounds.  Past Medical History:  Diagnosis Date  . ELEVATED BLOOD PRESSURE 07/19/2009  . GANGLION CYST 07/19/2009  . GERD 07/19/2009  . Prepatellar bursitis 07/19/2009   Past Surgical History:  Procedure Laterality Date  . THUMB ARTHROSCOPY  1993   re-attachment Rt thumb    reports that he quit smoking about 20 years ago. His smoking use included cigarettes. He has a 3.00 pack-year smoking history. He has never used smokeless tobacco. He reports that he does not drink alcohol or use drugs. family history includes Arthritis in his other; Cancer in his other; Diabetes in his brother, maternal grandfather, mother, and other; Hypertension in his mother and other. No Known Allergies   Review of Systems  Constitutional: Negative for chills, fatigue, fever and unexpected weight change.  HENT: Negative for trouble swallowing.   Eyes: Negative for visual disturbance.  Respiratory: Negative for cough, chest tightness and shortness of breath.   Cardiovascular: Negative for chest pain, palpitations and leg swelling.  Neurological: Positive for numbness. Negative for dizziness, syncope, facial asymmetry, speech difficulty, weakness, light-headedness and  headaches.  Psychiatric/Behavioral: Negative for confusion.       Objective:   Physical Exam  Constitutional: He is oriented to person, place, and time. He appears well-developed and well-nourished.  HENT:  Right Ear: External ear normal.  Left Ear: External ear normal.  Mouth/Throat: Oropharynx is clear and moist.  Eyes: Pupils are equal, round, and reactive to light.  Neck: Neck supple. No thyromegaly present.  Cardiovascular: Normal rate and regular rhythm.  Pulmonary/Chest: Effort normal and breath sounds normal. No respiratory distress. He has no wheezes. He has no rales.  Musculoskeletal: He exhibits no edema.  Neurological: He is alert and oriented to person, place, and time. No cranial nerve deficit. Coordination normal.  He has full strength throughout lower extremities.  Normal sensory function to monofilament testing throughout.  Deep tendon reflexes are 1+ ankle and difficult to elicit knee bilaterally. Gait normal.  Normal cerebellar.         Assessment:     #1 paresthesias involving left foot.  Normal sensory function at this time.  Symptoms have improved.  Etiology unclear.  No associated weakness.  No radiculitis symptoms.  #2 hypertension poorly controlled    Plan:     -Increase amlodipine to 10 mg daily -We discussed possible further evaluation of left foot paresthesias including neurology referral and at this point he declines.  Follow-up immediately for any recurrent numbness, weakness, or any other new symptoms -otherwise follow up within one month to reassess.  [name] MD [facility] at [facility]

## 2018-10-14 ENCOUNTER — Ambulatory Visit: Payer: BLUE CROSS/BLUE SHIELD | Admitting: Family Medicine

## 2018-10-28 ENCOUNTER — Other Ambulatory Visit: Payer: Self-pay | Admitting: Family Medicine

## 2018-11-04 ENCOUNTER — Encounter: Payer: Self-pay | Admitting: Family Medicine

## 2018-11-04 ENCOUNTER — Ambulatory Visit (INDEPENDENT_AMBULATORY_CARE_PROVIDER_SITE_OTHER): Payer: BLUE CROSS/BLUE SHIELD | Admitting: Family Medicine

## 2018-11-04 ENCOUNTER — Other Ambulatory Visit: Payer: Self-pay

## 2018-11-04 VITALS — BP 130/80 | HR 83 | Temp 98.0°F | Ht 69.0 in | Wt 274.5 lb

## 2018-11-04 DIAGNOSIS — D72829 Elevated white blood cell count, unspecified: Secondary | ICD-10-CM

## 2018-11-04 DIAGNOSIS — Z0000 Encounter for general adult medical examination without abnormal findings: Secondary | ICD-10-CM | POA: Diagnosis not present

## 2018-11-04 NOTE — Patient Instructions (Signed)
Try to lose some weight Monitor blood pressure and be in touch if consistently > 140/90 

## 2018-11-04 NOTE — Progress Notes (Signed)
Subjective:     Patient ID: Benjamin Odom, male   DOB: June 07, 1971, 47 y.o.   MRN: [id number]  HPI Patient here for complete physical.  He has history of hypertension currently treated with amlodipine 10 mg daily.  We recently increased his dosage.  He does not get any consistent exercise.  Poor compliance with diet at times.  Takes no other medications.  He has had prediabetes range blood sugars in the past.  Last A1c was 5.9%.  Flu vaccine already given.  He had tetanus January 2018.  Never smoked.  No alcohol use.  Family history reviewed with no changes.  Past Medical History:  Diagnosis Date  . ELEVATED BLOOD PRESSURE 07/19/2009  . GANGLION CYST 07/19/2009  . GERD 07/19/2009  . Prepatellar bursitis 07/19/2009   Past Surgical History:  Procedure Laterality Date  . THUMB ARTHROSCOPY  1993   re-attachment Rt thumb    reports that he quit smoking about 20 years ago. His smoking use included cigarettes. He has a 3.00 pack-year smoking history. He has never used smokeless tobacco. He reports that he does not drink alcohol or use drugs. family history includes Arthritis in his other; Cancer in his other; Diabetes in his brother, maternal grandfather, mother, and other; Hypertension in his mother and other. No Known Allergies   Review of Systems  Constitutional: Negative for activity change, appetite change, fatigue and fever.  HENT: Negative for congestion, ear pain and trouble swallowing.   Eyes: Negative for pain and visual disturbance.  Respiratory: Negative for cough, shortness of breath and wheezing.   Cardiovascular: Negative for chest pain and palpitations.  Gastrointestinal: Negative for abdominal distention, abdominal pain, blood in stool, constipation, diarrhea, nausea, rectal pain and vomiting.  Genitourinary: Negative for dysuria, hematuria and testicular pain.  Musculoskeletal: Negative for arthralgias and joint swelling.  Skin: Negative for rash.  Neurological:  Negative for dizziness, syncope and headaches.  Hematological: Negative for adenopathy.  Psychiatric/Behavioral: Negative for confusion and dysphoric mood.       Objective:   Physical Exam  Constitutional: He is oriented to person, place, and time. He appears well-developed and well-nourished. No distress.  HENT:  Head: Normocephalic and atraumatic.  Right Ear: External ear normal.  Left Ear: External ear normal.  Mouth/Throat: Oropharynx is clear and moist.  Eyes: Pupils are equal, round, and reactive to light. Conjunctivae and EOM are normal.  Neck: Normal range of motion. Neck supple. No thyromegaly present.  Cardiovascular: Normal rate, regular rhythm and normal heart sounds.  No murmur heard. Pulmonary/Chest: No respiratory distress. He has no wheezes. He has no rales.  Abdominal: Soft. Bowel sounds are normal. He exhibits no distension and no mass. There is no tenderness. There is no rebound and no guarding.  Musculoskeletal: He exhibits no edema.  Lymphadenopathy:    He has no cervical adenopathy.  Neurological: He is alert and oriented to person, place, and time. He displays normal reflexes. No cranial nerve deficit.  Skin: No rash noted.  Psychiatric: He has a normal mood and affect.       Assessment:     Physical exam.  Follow-up blood pressure after rest left arm seated with large cuff 130/80 which is improved from last visit    Plan:     -Obtain screening labs -We discussed pros and cons of PSA screening and patient wishes to proceed -He is strongly encouraged to lose some weight.  We discussed healthy strategies. -Continue amlodipine 10 mg daily -Routine follow-up in 1  year and sooner as needed  Kristian CoveyBruce W Alajah Witman MD [facility]

## 2018-11-05 LAB — LIPID PANEL
Cholesterol: 184 mg/dL (ref 0–200)
HDL: 37.9 mg/dL — ABNORMAL LOW (ref >39.00)
LDL CALC: 118 mg/dL — AB (ref 0–99)
NONHDL: 146.52
Total CHOL/HDL Ratio: 5
Triglycerides: 145 mg/dL (ref 0.0–149.0)
VLDL: 29 mg/dL (ref 0.0–40.0)

## 2018-11-05 LAB — HEMOGLOBIN A1C: Hgb A1c MFr Bld: 6.4 % (ref 4.6–6.5)

## 2018-11-05 LAB — CBC WITH DIFFERENTIAL/PLATELET
Basophils Absolute: 0.2 10*3/uL — ABNORMAL HIGH (ref 0.0–0.1)
Basophils Relative: 1.2 % (ref 0.0–3.0)
EOS ABS: 0.1 10*3/uL (ref 0.0–0.7)
EOS PCT: 0.5 % (ref 0.0–5.0)
HEMATOCRIT: 42.9 % (ref 39.0–52.0)
Hemoglobin: 14.4 g/dL (ref 13.0–17.0)
Lymphocytes Relative: 19.3 % (ref 12.0–46.0)
Lymphs Abs: 3 10*3/uL (ref 0.7–4.0)
MCHC: 33.6 g/dL (ref 30.0–36.0)
MCV: 87 fl (ref 78.0–100.0)
MONO ABS: 0.9 10*3/uL (ref 0.1–1.0)
Monocytes Relative: 5.6 % (ref 3.0–12.0)
Neutro Abs: 11.3 10*3/uL — ABNORMAL HIGH (ref 1.4–7.7)
Neutrophils Relative %: 73.4 % (ref 43.0–77.0)
Platelets: 488 10*3/uL — ABNORMAL HIGH (ref 150.0–400.0)
RBC: 4.93 Mil/uL (ref 4.22–5.81)
RDW: 14.5 % (ref 11.5–15.5)
WBC: 15.4 10*3/uL — AB (ref 4.0–10.5)

## 2018-11-05 LAB — BASIC METABOLIC PANEL
BUN: 11 mg/dL (ref 6–23)
CO2: 29 mEq/L (ref 19–32)
CREATININE: 0.92 mg/dL (ref 0.40–1.50)
Calcium: 10.3 mg/dL (ref 8.4–10.5)
Chloride: 103 mEq/L (ref 96–112)
GFR: 112.58 mL/min (ref >60.00)
GLUCOSE: 79 mg/dL (ref 70–99)
Potassium: 4.5 mEq/L (ref 3.5–5.1)
Sodium: 140 mEq/L (ref 135–145)

## 2018-11-05 LAB — HEPATIC FUNCTION PANEL
ALT: 21 U/L (ref 0–53)
AST: 18 U/L (ref 0–37)
Albumin: 4.6 g/dL (ref 3.5–5.2)
Alkaline Phosphatase: 77 U/L (ref 39–117)
BILIRUBIN DIRECT: 0.1 mg/dL (ref 0.0–0.3)
TOTAL PROTEIN: 7.5 g/dL (ref 6.0–8.3)
Total Bilirubin: 0.6 mg/dL (ref 0.2–1.2)

## 2018-11-05 LAB — TSH: TSH: 1.07 u[IU]/mL (ref 0.35–4.50)

## 2018-11-05 LAB — PSA: PSA: 0.21 ng/mL (ref 0.10–4.00)

## 2018-11-08 NOTE — Addendum Note (Signed)
Addended by: Johnella MoloneyFUNDERBURK, Hanni Milford A on: 11/08/2018 10:29 AM   Modules accepted: Orders

## 2018-11-29 ENCOUNTER — Other Ambulatory Visit: Payer: BLUE CROSS/BLUE SHIELD

## 2018-12-02 ENCOUNTER — Other Ambulatory Visit: Payer: BLUE CROSS/BLUE SHIELD

## 2019-09-17 ENCOUNTER — Other Ambulatory Visit: Payer: Self-pay | Admitting: Family Medicine

## 2019-09-17 MED ORDER — AMLODIPINE BESYLATE 10 MG PO TABS: 10.0000 mg | ORAL_TABLET | Freq: Every day | ORAL | 2 refills | Status: AC

## 2019-11-29 DIAGNOSIS — Z0100 Encounter for examination of eyes and vision without abnormal findings: Secondary | ICD-10-CM | POA: Diagnosis not present

## 2020-02-20 ENCOUNTER — Ambulatory Visit: Payer: BC Managed Care – PPO | Attending: Internal Medicine

## 2020-02-20 DIAGNOSIS — Z23 Encounter for immunization: Secondary | ICD-10-CM

## 2020-02-20 NOTE — Progress Notes (Signed)
   Covid-19 Vaccination Clinic  Name:  Benjamin Odom    MRN: [id number] DOB: 10/18/1971  02/20/2020  Mr. [name] was observed post Covid-19 immunization for 15 minutes without incident. He was provided with Vaccine Information Sheet and instruction to access the V-Safe system.   Mr. [name] was instructed to call 911 with any severe reactions post vaccine: Marland Kitchen Difficulty breathing  . Swelling of face and throat  . A fast heartbeat  . A bad rash all over body  . Dizziness and weakness   Immunizations Administered    Name Date Dose VIS Date Route   Pfizer COVID-19 Vaccine 02/20/2020  4:20 PM 0.3 mL 11/14/2019 Intramuscular   Manufacturer: [employer], [employer]   Lot: [id number]   NDC: [address]-1

## 2020-03-17 ENCOUNTER — Ambulatory Visit: Payer: BC Managed Care – PPO | Attending: Internal Medicine

## 2020-03-17 DIAGNOSIS — Z23 Encounter for immunization: Secondary | ICD-10-CM

## 2020-03-17 NOTE — Progress Notes (Signed)
   Covid-19 Vaccination Clinic  Name:  Benjamin Odom    MRN: [id number] DOB: [birth year]  03/17/2020  [name] was observed post Covid-19 immunization for 15 minutes without incident. He was provided with Vaccine Information Sheet and instruction to access the V-Safe system.   Mr. Daily was instructed to call 911 with any severe reactions post vaccine: Marland Kitchen Difficulty breathing  . Swelling of face and throat  . A fast heartbeat  . A bad rash all over body  . Dizziness and weakness   Immunizations Administered    Name Date Dose VIS Date Route   Pfizer COVID-19 Vaccine 03/17/2020  3:36 PM 0.3 mL 11/14/2019 Intramuscular   Manufacturer: [employer], [employer]   Lot: [id number]   NDC: [address]-1

## 2020-06-22 ENCOUNTER — Ambulatory Visit: Payer: Self-pay

## 2020-06-22 ENCOUNTER — Other Ambulatory Visit: Payer: Self-pay | Admitting: Occupational Medicine

## 2020-06-22 ENCOUNTER — Other Ambulatory Visit: Payer: Self-pay

## 2020-06-22 DIAGNOSIS — M25522 Pain in left elbow: Secondary | ICD-10-CM

## 2020-12-03 IMAGING — DX DG ELBOW COMPLETE 3+V*L*
4 series · 4 of 4 positions shown · non-contrast
Comparison: None.

CLINICAL DATA: Left elbow pain after injury today.

EXAM:
LEFT ELBOW - COMPLETE 3+ VIEW

[elbow ap]
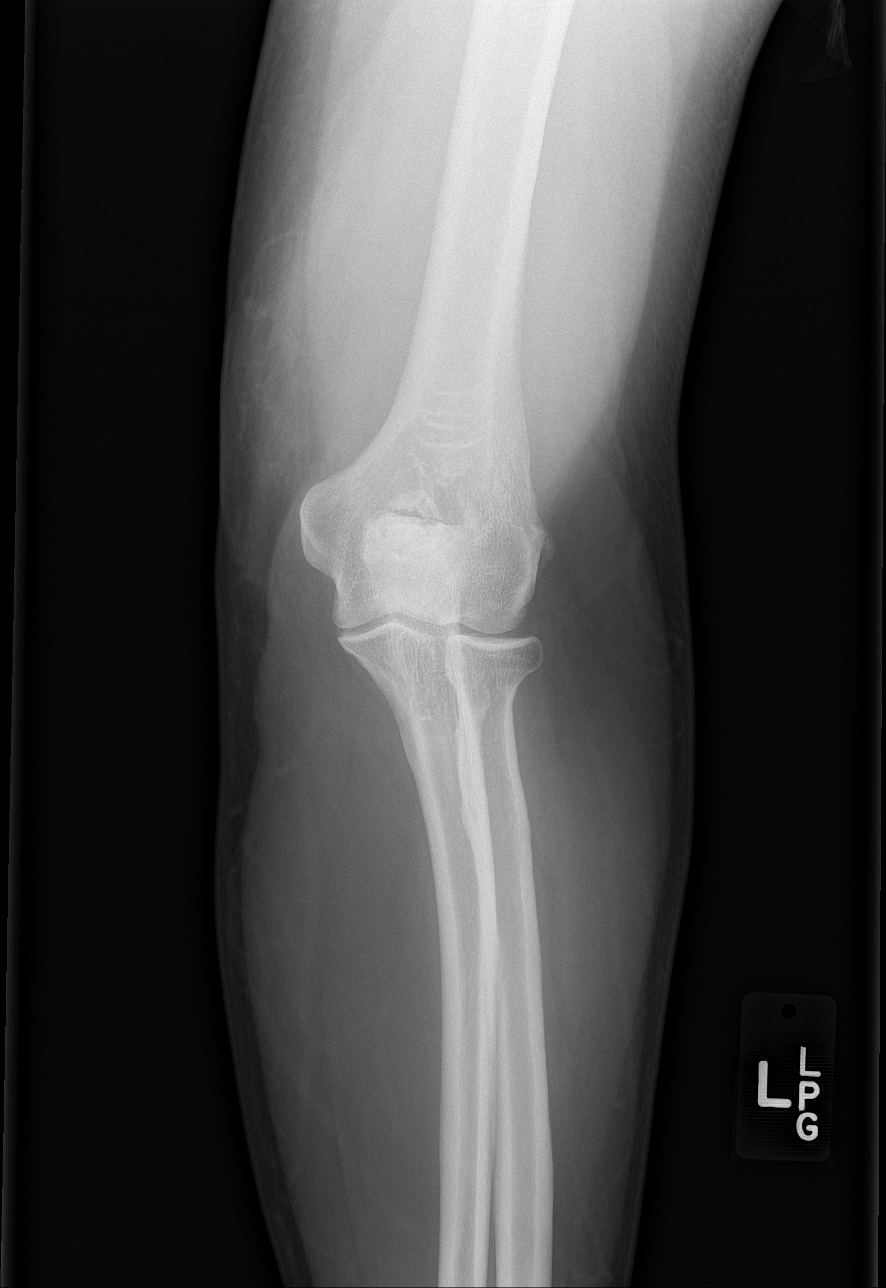

[elbow obl (1 of 2)]
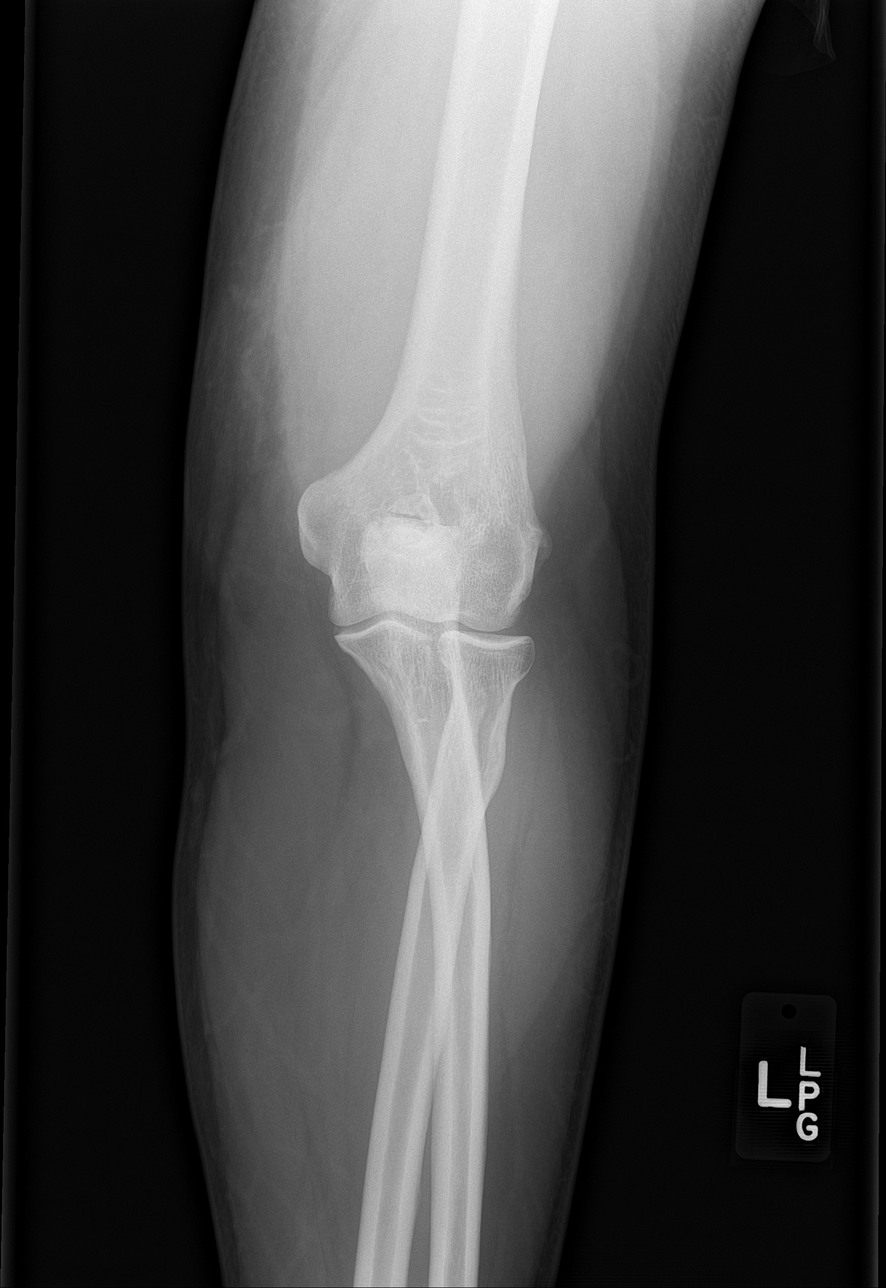

[elbow obl (2 of 2)]
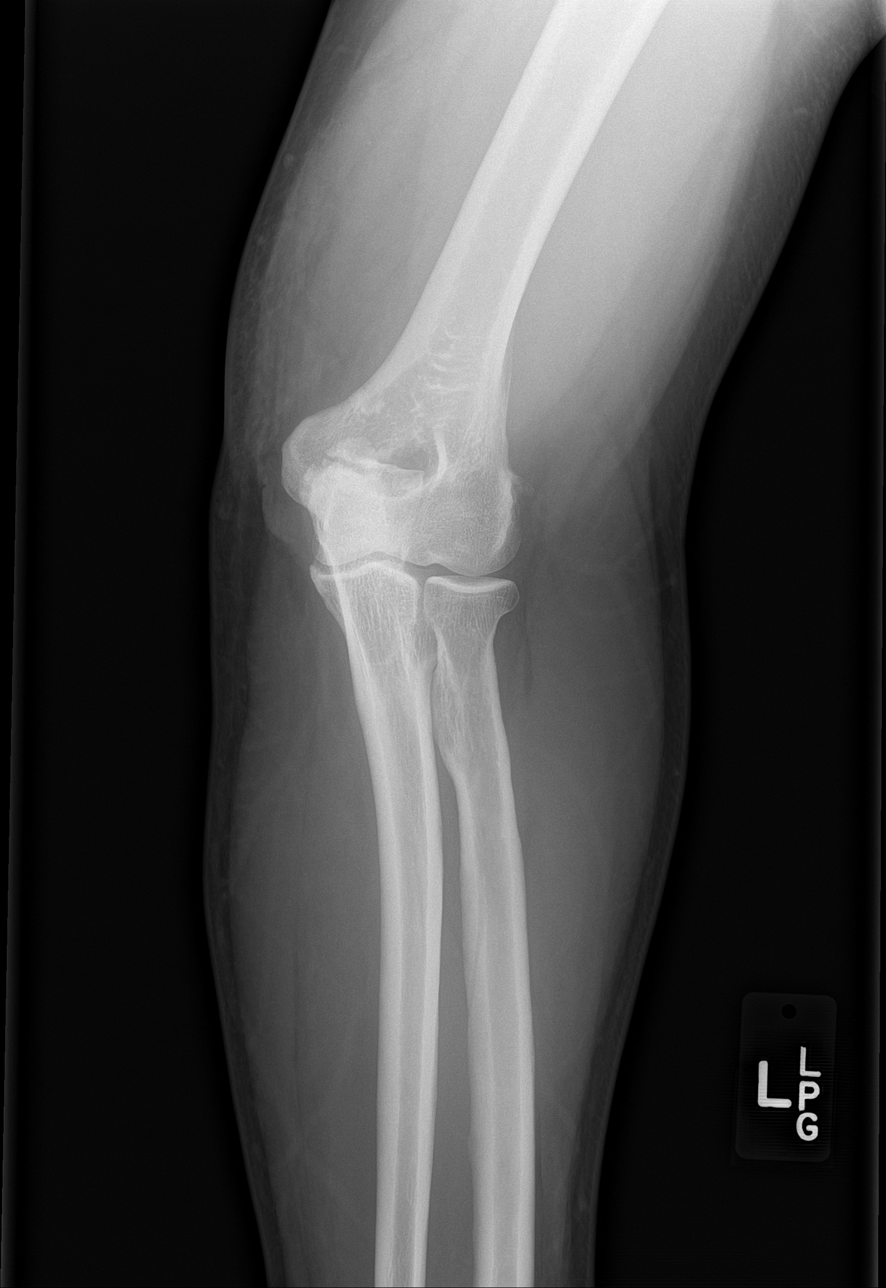

[elbow lat]
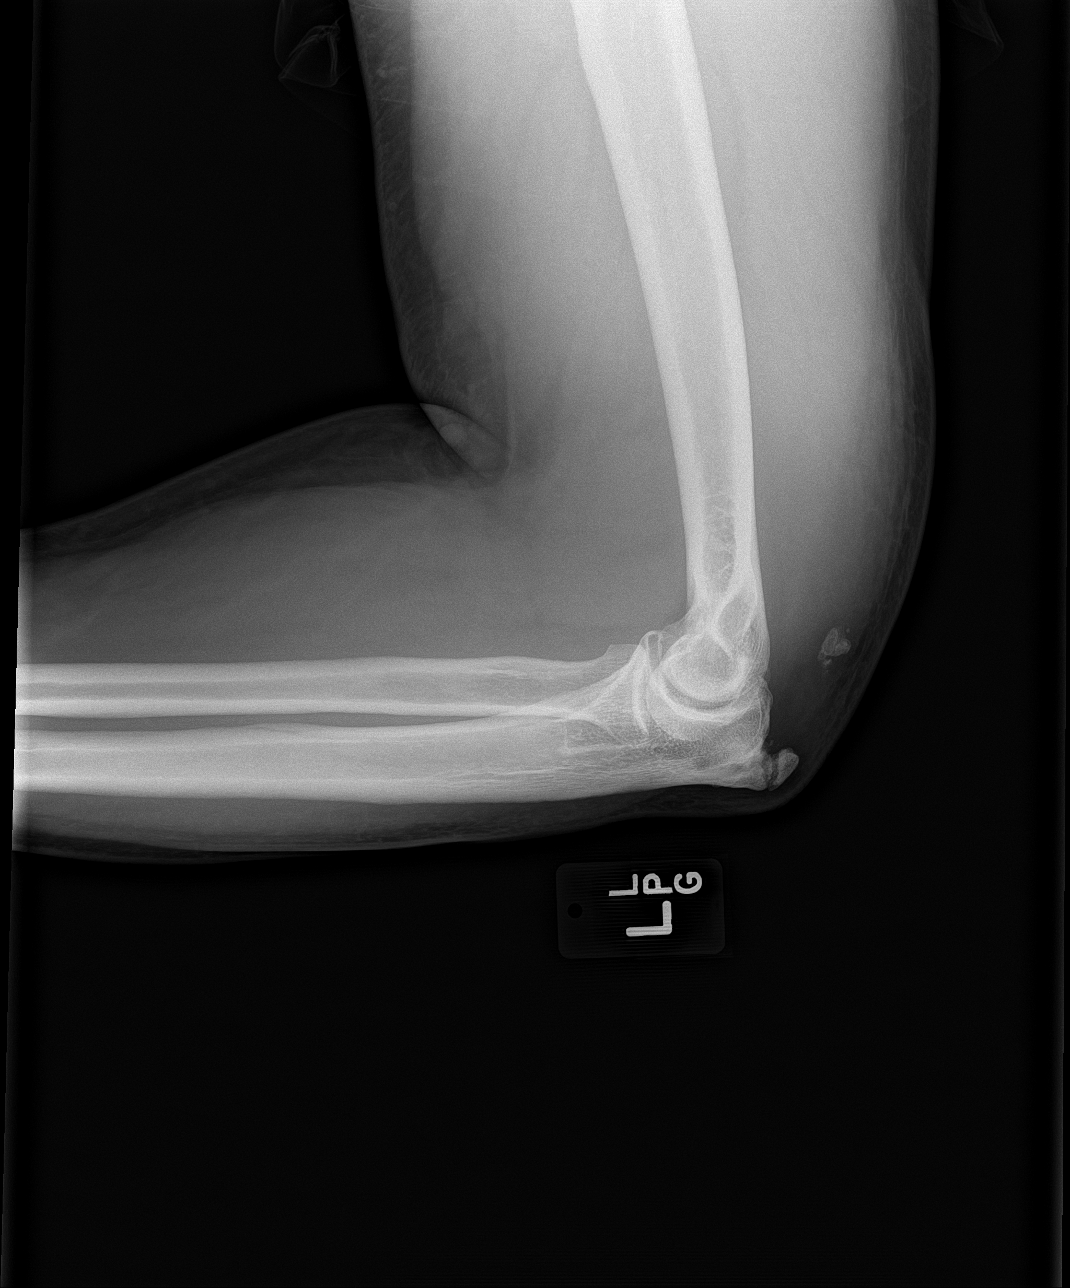

[4 of 4 positions shown; findings below may reference images not displayed]

FINDINGS: There is no evidence of dislocation, or joint effusion. Mildly
displaced fracture is seen involving large posterior olecranon spur
of indeterminate age. Joint spaces are unremarkable. Bursal
calcifications may be present posterior to the distal humerus.
IMPRESSION: Mildly displaced fracture is seen involving large posterior
olecranon spur of indeterminate age. Bursal calcifications may be
present posterior to the distal humerus.
